# Patient Record
Sex: Male | Born: 1945 | Race: White | Hispanic: No | Smoking: Former smoker
Health system: Southern US, Community
[De-identification: ages and names within clinical notes are randomized; demographics above are authoritative.]

## PROBLEM LIST (undated history)

## (undated) DIAGNOSIS — I2510 Atherosclerotic heart disease of native coronary artery without angina pectoris: Secondary | ICD-10-CM

## (undated) DIAGNOSIS — I739 Peripheral vascular disease, unspecified: Secondary | ICD-10-CM

## (undated) DIAGNOSIS — I10 Essential (primary) hypertension: Secondary | ICD-10-CM

## (undated) DIAGNOSIS — I639 Cerebral infarction, unspecified: Secondary | ICD-10-CM

## (undated) DIAGNOSIS — J449 Chronic obstructive pulmonary disease, unspecified: Secondary | ICD-10-CM

## (undated) DIAGNOSIS — I340 Nonrheumatic mitral (valve) insufficiency: Secondary | ICD-10-CM

## (undated) DIAGNOSIS — E785 Hyperlipidemia, unspecified: Secondary | ICD-10-CM

## (undated) DIAGNOSIS — E119 Type 2 diabetes mellitus without complications: Secondary | ICD-10-CM

## (undated) DIAGNOSIS — I482 Chronic atrial fibrillation, unspecified: Secondary | ICD-10-CM

## (undated) HISTORY — PX: CHOLECYSTECTOMY: SHX55

## (undated) HISTORY — PX: OTHER SURGICAL HISTORY: SHX169

## (undated) HISTORY — PX: APPENDECTOMY: SHX54

## (undated) HISTORY — DX: Type 2 diabetes mellitus without complications: E11.9

## (undated) HISTORY — DX: Hyperlipidemia, unspecified: E78.5

## (undated) HISTORY — DX: Chronic obstructive pulmonary disease, unspecified: J44.9

## (undated) HISTORY — DX: Chronic atrial fibrillation, unspecified: I48.20

## (undated) HISTORY — DX: Nonrheumatic mitral (valve) insufficiency: I34.0

## (undated) HISTORY — DX: Essential (primary) hypertension: I10

## (undated) HISTORY — DX: Atherosclerotic heart disease of native coronary artery without angina pectoris: I25.10

## (undated) HISTORY — DX: Peripheral vascular disease, unspecified: I73.9

## (undated) HISTORY — DX: Cerebral infarction, unspecified: I63.9

---

## 1995-02-17 DIAGNOSIS — I639 Cerebral infarction, unspecified: Secondary | ICD-10-CM

## 1995-02-17 HISTORY — DX: Cerebral infarction, unspecified: I63.9

## 1997-07-11 ENCOUNTER — Inpatient Hospital Stay (HOSPITAL_COMMUNITY): Admission: AD | Admit: 1997-07-11 | Discharge: 1997-07-14 | Payer: Self-pay | Admitting: *Deleted

## 1997-11-30 ENCOUNTER — Inpatient Hospital Stay (HOSPITAL_COMMUNITY): Admission: EM | Admit: 1997-11-30 | Discharge: 1997-12-13 | Payer: Self-pay | Admitting: *Deleted

## 2007-04-08 ENCOUNTER — Encounter: Payer: Self-pay | Admitting: Cardiovascular Disease

## 2007-04-12 ENCOUNTER — Encounter: Payer: Self-pay | Admitting: Cardiovascular Disease

## 2007-05-22 ENCOUNTER — Encounter: Payer: Self-pay | Admitting: Cardiovascular Disease

## 2008-04-22 ENCOUNTER — Encounter: Payer: Self-pay | Admitting: Cardiovascular Disease

## 2008-04-22 ENCOUNTER — Ambulatory Visit: Payer: Self-pay | Admitting: Family Medicine

## 2008-04-22 DIAGNOSIS — I219 Acute myocardial infarction, unspecified: Secondary | ICD-10-CM | POA: Insufficient documentation

## 2008-04-23 ENCOUNTER — Encounter: Payer: Self-pay | Admitting: Family Medicine

## 2008-04-25 ENCOUNTER — Encounter: Payer: Self-pay | Admitting: Cardiovascular Disease

## 2008-04-26 ENCOUNTER — Encounter: Payer: Self-pay | Admitting: Cardiovascular Disease

## 2008-11-20 ENCOUNTER — Encounter: Payer: Self-pay | Admitting: Cardiovascular Disease

## 2009-01-23 ENCOUNTER — Encounter: Payer: Self-pay | Admitting: Cardiovascular Disease

## 2009-03-24 ENCOUNTER — Ambulatory Visit: Payer: Self-pay | Admitting: Cardiology

## 2009-03-24 ENCOUNTER — Inpatient Hospital Stay (HOSPITAL_COMMUNITY): Admission: EM | Admit: 2009-03-24 | Discharge: 2009-03-29 | Payer: Self-pay | Admitting: Emergency Medicine

## 2009-03-24 ENCOUNTER — Encounter: Payer: Self-pay | Admitting: Cardiovascular Disease

## 2009-03-25 ENCOUNTER — Ambulatory Visit: Payer: Self-pay | Admitting: Vascular Surgery

## 2009-03-25 ENCOUNTER — Encounter (INDEPENDENT_AMBULATORY_CARE_PROVIDER_SITE_OTHER): Payer: Self-pay | Admitting: Internal Medicine

## 2009-03-28 ENCOUNTER — Encounter: Payer: Self-pay | Admitting: Cardiology

## 2009-04-15 ENCOUNTER — Telehealth (INDEPENDENT_AMBULATORY_CARE_PROVIDER_SITE_OTHER): Payer: Self-pay | Admitting: *Deleted

## 2009-04-16 ENCOUNTER — Ambulatory Visit: Payer: Self-pay | Admitting: Cardiovascular Disease

## 2009-04-16 DIAGNOSIS — R0989 Other specified symptoms and signs involving the circulatory and respiratory systems: Secondary | ICD-10-CM | POA: Insufficient documentation

## 2009-04-16 DIAGNOSIS — I4891 Unspecified atrial fibrillation: Secondary | ICD-10-CM

## 2009-04-16 DIAGNOSIS — R0609 Other forms of dyspnea: Secondary | ICD-10-CM

## 2009-04-16 DIAGNOSIS — R079 Chest pain, unspecified: Secondary | ICD-10-CM

## 2009-04-16 DIAGNOSIS — E785 Hyperlipidemia, unspecified: Secondary | ICD-10-CM

## 2009-04-16 DIAGNOSIS — I2510 Atherosclerotic heart disease of native coronary artery without angina pectoris: Secondary | ICD-10-CM

## 2009-04-16 DIAGNOSIS — I10 Essential (primary) hypertension: Secondary | ICD-10-CM

## 2009-04-16 DIAGNOSIS — R0602 Shortness of breath: Secondary | ICD-10-CM | POA: Insufficient documentation

## 2009-04-16 LAB — CONVERTED CEMR LAB: POC INR: 1.6

## 2009-04-22 ENCOUNTER — Telehealth (INDEPENDENT_AMBULATORY_CARE_PROVIDER_SITE_OTHER): Payer: Self-pay | Admitting: Radiology

## 2009-04-23 ENCOUNTER — Ambulatory Visit: Payer: Self-pay | Admitting: Cardiovascular Disease

## 2009-04-23 ENCOUNTER — Encounter (HOSPITAL_COMMUNITY): Admission: RE | Admit: 2009-04-23 | Discharge: 2009-06-18 | Payer: Self-pay | Admitting: Cardiovascular Disease

## 2009-04-23 ENCOUNTER — Ambulatory Visit: Payer: Self-pay

## 2009-04-25 ENCOUNTER — Telehealth: Payer: Self-pay | Admitting: Cardiovascular Disease

## 2009-04-29 ENCOUNTER — Encounter: Payer: Self-pay | Admitting: Internal Medicine

## 2009-04-29 ENCOUNTER — Encounter (INDEPENDENT_AMBULATORY_CARE_PROVIDER_SITE_OTHER): Payer: Self-pay | Admitting: Cardiology

## 2009-04-29 ENCOUNTER — Ambulatory Visit: Payer: Self-pay | Admitting: Internal Medicine

## 2009-04-29 ENCOUNTER — Ambulatory Visit: Payer: Self-pay | Admitting: Cardiovascular Disease

## 2009-05-03 ENCOUNTER — Telehealth: Payer: Self-pay | Admitting: Cardiovascular Disease

## 2009-05-06 ENCOUNTER — Ambulatory Visit: Payer: Self-pay | Admitting: Internal Medicine

## 2009-05-13 ENCOUNTER — Ambulatory Visit: Payer: Self-pay | Admitting: Internal Medicine

## 2009-05-13 ENCOUNTER — Encounter: Payer: Self-pay | Admitting: Cardiovascular Disease

## 2009-05-20 ENCOUNTER — Ambulatory Visit: Payer: Self-pay | Admitting: Internal Medicine

## 2009-05-20 LAB — CONVERTED CEMR LAB: POC INR: 3

## 2009-05-30 ENCOUNTER — Ambulatory Visit: Payer: Self-pay | Admitting: Cardiovascular Disease

## 2009-05-30 ENCOUNTER — Ambulatory Visit: Payer: Self-pay | Admitting: Internal Medicine

## 2009-05-30 LAB — CONVERTED CEMR LAB: POC INR: 2.9

## 2009-06-06 ENCOUNTER — Ambulatory Visit: Payer: Self-pay | Admitting: Cardiovascular Disease

## 2009-06-06 ENCOUNTER — Ambulatory Visit: Payer: Self-pay | Admitting: Cardiology

## 2009-06-06 ENCOUNTER — Ambulatory Visit (HOSPITAL_COMMUNITY): Admission: RE | Admit: 2009-06-06 | Discharge: 2009-06-06 | Payer: Self-pay | Admitting: Cardiovascular Disease

## 2009-06-11 ENCOUNTER — Ambulatory Visit: Payer: Self-pay | Admitting: Cardiovascular Disease

## 2009-06-11 LAB — CONVERTED CEMR LAB: POC INR: 3.2

## 2009-07-08 ENCOUNTER — Ambulatory Visit: Payer: Self-pay | Admitting: Internal Medicine

## 2009-07-17 ENCOUNTER — Ambulatory Visit: Payer: Self-pay | Admitting: Cardiology

## 2009-07-17 HISTORY — PX: OTHER SURGICAL HISTORY: SHX169

## 2009-07-17 LAB — CONVERTED CEMR LAB: POC INR: 2.7

## 2009-07-24 ENCOUNTER — Ambulatory Visit: Payer: Self-pay | Admitting: Cardiovascular Disease

## 2009-07-24 LAB — CONVERTED CEMR LAB: POC INR: 3.3

## 2009-07-29 ENCOUNTER — Ambulatory Visit: Payer: Self-pay | Admitting: Cardiovascular Disease

## 2009-08-06 ENCOUNTER — Ambulatory Visit: Payer: Self-pay | Admitting: Internal Medicine

## 2009-08-06 LAB — CONVERTED CEMR LAB
BUN: 13 mg/dL (ref 6–23)
Calcium: 9.4 mg/dL (ref 8.4–10.5)
Chloride: 107 meq/L (ref 96–112)
Creatinine, Ser: 0.9 mg/dL (ref 0.4–1.5)
Eosinophils Absolute: 0.1 10*3/uL (ref 0.0–0.7)
Eosinophils Relative: 3 % (ref 0.0–5.0)
Hemoglobin: 14.5 g/dL (ref 13.0–17.0)
INR: 1.8 — ABNORMAL HIGH (ref 0.8–1.0)
Lymphocytes Relative: 19.4 % (ref 12.0–46.0)
MCV: 88.8 fL (ref 78.0–100.0)
Neutro Abs: 3.4 10*3/uL (ref 1.4–7.7)
POC INR: 1.9
Platelets: 207 10*3/uL (ref 150.0–400.0)
Potassium: 4.2 meq/L (ref 3.5–5.1)
aPTT: 34.3 s — ABNORMAL HIGH (ref 21.7–28.8)

## 2009-08-08 ENCOUNTER — Encounter: Payer: Self-pay | Admitting: Internal Medicine

## 2009-08-08 ENCOUNTER — Ambulatory Visit (HOSPITAL_BASED_OUTPATIENT_CLINIC_OR_DEPARTMENT_OTHER)
Admission: RE | Admit: 2009-08-08 | Discharge: 2009-08-08 | Payer: Self-pay | Source: Home / Self Care | Admitting: Internal Medicine

## 2009-08-12 ENCOUNTER — Ambulatory Visit: Payer: Self-pay | Admitting: Cardiology

## 2009-08-12 ENCOUNTER — Ambulatory Visit (HOSPITAL_COMMUNITY)
Admission: RE | Admit: 2009-08-12 | Discharge: 2009-08-12 | Payer: Self-pay | Source: Home / Self Care | Admitting: Cardiology

## 2009-08-12 LAB — CONVERTED CEMR LAB: POC INR: 2.8

## 2009-08-13 ENCOUNTER — Ambulatory Visit (HOSPITAL_COMMUNITY): Admission: RE | Admit: 2009-08-13 | Discharge: 2009-08-14 | Payer: Self-pay | Admitting: Internal Medicine

## 2009-08-13 ENCOUNTER — Ambulatory Visit: Payer: Self-pay | Admitting: Internal Medicine

## 2009-08-14 ENCOUNTER — Ambulatory Visit (HOSPITAL_COMMUNITY): Admission: RE | Admit: 2009-08-14 | Discharge: 2009-08-14 | Payer: Self-pay | Admitting: Internal Medicine

## 2009-08-21 ENCOUNTER — Ambulatory Visit: Payer: Self-pay | Admitting: Cardiology

## 2009-08-21 ENCOUNTER — Ambulatory Visit: Payer: Self-pay | Admitting: Internal Medicine

## 2009-08-21 LAB — CONVERTED CEMR LAB: POC INR: 1.9

## 2009-08-24 ENCOUNTER — Ambulatory Visit: Payer: Self-pay | Admitting: Pulmonary Disease

## 2009-08-26 ENCOUNTER — Ambulatory Visit: Payer: Self-pay | Admitting: Internal Medicine

## 2009-09-06 ENCOUNTER — Ambulatory Visit: Payer: Self-pay | Admitting: Internal Medicine

## 2009-09-06 ENCOUNTER — Ambulatory Visit: Payer: Self-pay | Admitting: Cardiology

## 2009-09-06 LAB — CONVERTED CEMR LAB: POC INR: 1.8

## 2009-09-09 ENCOUNTER — Encounter: Payer: Self-pay | Admitting: Internal Medicine

## 2009-09-13 ENCOUNTER — Encounter: Payer: Self-pay | Admitting: Internal Medicine

## 2009-09-17 ENCOUNTER — Encounter: Payer: Self-pay | Admitting: Internal Medicine

## 2009-10-01 ENCOUNTER — Ambulatory Visit: Payer: Self-pay | Admitting: Internal Medicine

## 2009-10-01 ENCOUNTER — Ambulatory Visit (HOSPITAL_COMMUNITY): Admission: RE | Admit: 2009-10-01 | Discharge: 2009-10-01 | Payer: Self-pay | Admitting: Internal Medicine

## 2009-10-07 ENCOUNTER — Telehealth: Payer: Self-pay | Admitting: Internal Medicine

## 2009-10-28 ENCOUNTER — Telehealth: Payer: Self-pay | Admitting: Internal Medicine

## 2009-10-31 ENCOUNTER — Telehealth: Payer: Self-pay | Admitting: Internal Medicine

## 2009-10-31 ENCOUNTER — Encounter: Payer: Self-pay | Admitting: Internal Medicine

## 2009-11-08 ENCOUNTER — Encounter: Payer: Self-pay | Admitting: Internal Medicine

## 2009-11-11 ENCOUNTER — Ambulatory Visit: Payer: Self-pay | Admitting: Internal Medicine

## 2009-11-21 ENCOUNTER — Telehealth (INDEPENDENT_AMBULATORY_CARE_PROVIDER_SITE_OTHER): Payer: Self-pay | Admitting: *Deleted

## 2009-12-03 ENCOUNTER — Encounter: Payer: Self-pay | Admitting: Internal Medicine

## 2010-01-31 ENCOUNTER — Telehealth: Payer: Self-pay | Admitting: Internal Medicine

## 2010-02-24 ENCOUNTER — Ambulatory Visit
Admission: RE | Admit: 2010-02-24 | Discharge: 2010-02-24 | Payer: Self-pay | Source: Home / Self Care | Attending: Internal Medicine | Admitting: Internal Medicine

## 2010-02-24 ENCOUNTER — Encounter: Payer: Self-pay | Admitting: Internal Medicine

## 2010-02-24 LAB — CONVERTED CEMR LAB
Cholesterol, target level: 200 mg/dL
HDL goal, serum: 40 mg/dL
LDL Goal: 100 mg/dL

## 2010-03-16 LAB — CONVERTED CEMR LAB
AST: 34 units/L (ref 0–37)
Albumin: 4.5 g/dL (ref 3.5–5.2)
BUN: 11 mg/dL (ref 6–23)
CO2: 27 meq/L (ref 19–32)
Chloride: 102 meq/L (ref 96–112)
Creatinine, Ser: 1 mg/dL (ref 0.4–1.5)
GFR calc non Af Amer: 80.06 mL/min (ref >60)
POC INR: 2.2
TSH: 1.21 microintl units/mL (ref 0.35–5.50)
Total Bilirubin: 2.8 mg/dL — ABNORMAL HIGH (ref 0.3–1.2)
Total Protein: 7.1 g/dL (ref 6.0–8.3)

## 2010-03-20 NOTE — Medication Information (Signed)
Summary: rov/ewj  Anticoagulant Therapy  Managed by: Cloyde Reams, RN, BSN Referring MD: Clifton James PCP: Dr. Tonie Griffith Supervising MD: Tenny Craw MD, Gunnar Fusi Indication 1: Atrial Fibrillation Lab Used: [facility] [place] Site: [address] INR POC 3.8 INR RANGE 2.0-3.0  Dietary changes: yes       Details: Incr ETOH yesterday watching ball game and race.    Health status changes: no    Bleeding/hemorrhagic complications: no    Recent/future hospitalizations: no    Any changes in medication regimen? no    Recent/future dental: no  Any missed doses?: no       Is patient compliant with meds? yes       Allergies (verified): 1)  ! Simvastatin (Simvastatin)  Anticoagulation Management History:      The patient is taking warfarin and comes in today for a routine follow up visit.  Negative risk factors for bleeding include an age less than 74 years old.  The bleeding index is 'low risk'.  Positive CHADS2 values include History of HTN.  Negative CHADS2 values include Age > 82 years old.  Anticoagulation responsible provider: Tenny Craw MD, Gunnar Fusi.  INR POC: 3.8.  Cuvette Lot#: [id number].  Exp: 06/2010.    Anticoagulation Management Assessment/Plan:      The patient's current anticoagulation dose is Coumadin 5 mg tabs: [facility] [place].  The target INR is 2.0-3.0.  The next INR is due 05/20/2009.  Anticoagulation instructions were given to patient.  Results were reviewed/authorized by Cloyde Reams, RN, BSN.  He was notified by Cloyde Reams RN.         Prior Anticoagulation Instructions: INR 2.6  Continue on same dosage 1/2 tablet daily except 1 tablet on Mondays and Fridays.  Recheck in 1 week.    Current Anticoagulation Instructions: INR 3.8  Take 1/2 tablet today then resume same dosage 1/2 tablet daily except 1 tablet on Mondays and Fridays.  Recheck in 1 week.

## 2010-03-20 NOTE — Letter (Signed)
Summary: [facility] Cardiology Cardiac Evaluation  [facility] Cardiology Cardiac Evaluation   Imported By: Roderic Ovens 04/30/2009 15:13:43  _____________________________________________________________________  External Attachment:    Type:   Image     Comment:   External Document

## 2010-03-20 NOTE — Cardiovascular Report (Signed)
Summary: [facility]  [facility]   Imported By: Roderic Ovens 07/24/2009 15:41:31  _____________________________________________________________________  External Attachment:    Type:   Image     Comment:   External Document

## 2010-03-20 NOTE — Medication Information (Signed)
Summary: coumadin f/u sl  Anticoagulant Therapy  Managed by: Cloyde Reams, RN, BSN Referring MD: Clifton James PCP: Dr. Tonie Griffith Supervising MD: Tenny Craw MD, Gunnar Fusi Indication 1: Atrial Fibrillation Lab Used: [facility] [place] Site: [address] INR POC 2.6 INR RANGE 2.0-3.0  Dietary changes: no    Health status changes: no    Bleeding/hemorrhagic complications: no    Recent/future hospitalizations: no    Any changes in medication regimen? yes       Details: Off 325 ASA and Metoprolol.  Recent/future dental: no  Any missed doses?: no       Is patient compliant with meds? yes       Allergies (verified): 1)  ! Simvastatin (Simvastatin)  Anticoagulation Management History:      The patient is taking warfarin and comes in today for a routine follow up visit.  Negative risk factors for bleeding include an age less than 44 years old.  The bleeding index is 'low risk'.  Positive CHADS2 values include History of HTN.  Negative CHADS2 values include Age > 68 years old.  Anticoagulation responsible provider: Tenny Craw MD, Gunnar Fusi.  INR POC: 2.6.  Cuvette Lot#: [id number].  Exp: 06/2010.    Anticoagulation Management Assessment/Plan:      The patient's current anticoagulation dose is Coumadin 5 mg tabs: [facility] [place].  The target INR is 2.0-3.0.  The next INR is due 05/13/2009.  Anticoagulation instructions were given to patient.  Results were reviewed/authorized by Cloyde Reams, RN, BSN.  He was notified by Cloyde Reams RN.         Prior Anticoagulation Instructions: INR 2.7  Continue 1 tab each Monday and Friday and 0.5 tab other days.    Current Anticoagulation Instructions: INR 2.6  Continue on same dosage 1/2 tablet daily except 1 tablet on Mondays and Fridays.  Recheck in 1 week.

## 2010-03-20 NOTE — Assessment & Plan Note (Signed)
Summary: appt @ 10:30/eph   Visit Type:  Follow-up Referring Provider:  Dr Clifton Lynnsie Linders Primary Provider:  Dr. Tonie Griffith   History of Present Illness: The patient presents today for electrophysiology followup. He remains in afib post ablation. I tried to cardiovert him 8/11 post ablation and was unsuccessful.  He reports ongoing dypsnea and fatigue.  He had nausea with multaq but notes that this is "better than the side effects of amiodarone".  He is presently not on an antiarrhythmic drug.  The patient denies symptoms of palpitations, chest pain, orthopnea, PND, lower extremity edema, dizziness, presyncope, syncope, or neurologic sequela. The patient is tolerating medications without difficulties and is otherwise without complaint today.   Current Medications (verified): 1)  Aspir-Low 81 Mg Tbec (Aspirin) .... Once Daily 2)  Crestor 20 Mg Tabs (Rosuvastatin Calcium) .... Take One Tab By Mouth Once Daily 3)  Lisinopril 10 Mg Tabs (Lisinopril) .... Once Daily 4)  Niaspan 500 Mg Cr-Tabs (Niacin (Antihyperlipidemic)) .... Once Daily 5)  Pradaxa 150 Mg Caps (Dabigatran Etexilate Mesylate) .... One By Mouth Bid 6)  Levitra 20 Mg Tabs (Vardenafil Hcl) .... Prn 7)  Omeprazole 40 Mg Cpdr (Omeprazole) .... Prn 8)  Avodart 0.5 Mg Caps (Dutasteride) .... Take One Capsule By Mouth At Bedtime  Allergies: 1)  ! Simvastatin (Simvastatin)  Past History:  Past Medical History: Reviewed history from 08/26/2009 and no changes required. CAD s/p stents 1999, 2001-FFR of LAD 0.85 in March 2010, stress normal 3/11.  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 Cholecystectomy Appendectomy Traumatic thumb injury s/p repair Bilateral knee arthroscopic procedures s/p afib ablation  6/11  Social History: Reviewed history from 07/08/2009 and no changes required. Pt lives in [place], with a friend. Former tobacco abuse, quit 1999 Alcohol-12 pack per week No illicit drugs Disability Divorced 3 children  Review of Systems       All systems are reviewed and negative except as listed in the HPI.   Vital Signs:  Patient profile:   65 year old male Height:      73 inches Weight:      216 pounds BMI:     28.60 Pulse rate:   70 / minute Pulse rhythm:   irregularly irregular BP sitting:   110 / 80  (right arm)  Vitals Entered By: Laurance Flatten CMA (November 11, 2009 10:25 AM)  Physical Exam  General:  Well developed, well nourished, in no acute distress. Head:  normocephalic and atraumatic Eyes:  PERRLA/EOM intact; conjunctiva and lids normal. Mouth:  Teeth, gums and palate normal. Oral mucosa normal. Neck:  Neck supple, no JVD. No masses, thyromegaly or abnormal cervical nodes. Lungs:  Clear bilaterally to auscultation and percussion. Heart:  iRRR, no m/r/g Abdomen:  Bowel sounds positive; abdomen soft and non-tender without masses, organomegaly, or hernias noted. No hepatosplenomegaly. Msk:  Back normal, normal gait. Muscle strength and tone normal. Pulses:  pulses normal in all 4 extremities Extremities:  No clubbing or cyanosis. Neurologic:  Alert and oriented x 3. Skin:  Intact without lesions or rashes. Cervical Nodes:  no significant adenopathy Psych:  Normal affect.   EKG  Procedure date:  11/11/2009  Findings:      afib,  V rate 70 bpm, LAD, incomplete RBBB  Impression & Recommendations:  Problem # 1:  ATRIAL FIBRILLATION (ICD-427.31)  Refractory afib.  He has failed medical therapy with multaq and amiodarone.  He has had recurrent afib s/p ablation. Therapeutic strategies for afib including medicine and ablation were discussed in detail with the patient today. Risk, benefits, and alternatives to repeat EP study and radiofrequency  ablation for afib were also discussed in detail today. These risks include but are not limited to stroke, bleeding, vascular damage, tamponade, perforation, damage to the esophagus, lungs, and other structures, pulmonary vein stenosis, worsening renal function, and death. The patient understands these risk and wishes to proceed.  We will continue pradaxa 150mg  two times a day. He will proceed with cardiac CT prior to ablation to rule out pulmonary vein stenosis and evaluate the pulmonary anatomy prior to repeat ablation.  We will also plan TEE prior to ablation.  The following medications were removed from the medication list:    Multaq 400 Mg Tabs (Dronedarone hcl) .Marland Kitchen... Take one tab by mouth two times a day His updated medication list for this problem includes:    Aspir-low 81 Mg Tbec (Aspirin) ..... Once daily  Problem # 2:  HYPERTENSION, BENIGN (ICD-401.1)  stable His updated medication list for this problem includes:    Aspir-low 81 Mg Tbec (Aspirin) ..... Once daily    Lisinopril 10 Mg Tabs (Lisinopril) ..... Once daily  His updated medication list for this problem includes:    Aspir-low 81 Mg Tbec (Aspirin) ..... Once daily    Lisinopril 10 Mg Tabs (Lisinopril) ..... Once daily  Problem # 3:  CAD, NATIVE VESSEL (ICD-414.01)  no symptoms of ischemia continue medical therapy  His updated medication list for this problem includes:    Aspir-low 81 Mg Tbec (Aspirin) ..... Once daily    Lisinopril 10 Mg Tabs (Lisinopril) ..... Once daily  Problem # 4:  HYPERLIPIDEMIA-MIXED (ICD-272.4)  stable  His updated medication list for this problem includes:    Crestor 20 Mg Tabs (Rosuvastatin calcium) .Marland Kitchen... Take one tab by mouth once daily    Niaspan 500 Mg Cr-tabs (Niacin (antihyperlipidemic)) ..... Once daily  Other Orders: EKG w/ Interpretation (93000)  Patient Instructions: 1)  Your physician recommends that you schedule a follow-up appointment in: 4 weeks with Dr Johney Frame 2)  Will  schedule a repeat afib ablation for 12/24/09 at 5:30am  TEE on 12/23/09 and CT the week prior  will call to set all of these appointments up Prescriptions: PRADAXA 150 MG CAPS (DABIGATRAN ETEXILATE MESYLATE) one by mouth two times a day  #60 x 11   Entered by:   Laurance Flatten CMA   Authorized by:   Hillis Range, MD   Signed by:   Laurance Flatten CMA on 11/11/2009   Method used:   Print then Give to Patient   RxID:   [id number]

## 2010-03-20 NOTE — Letter (Signed)
Summary: [facility] Cardiology Office Note  [facility] Cardiology Office Note   Imported By: Roderic Ovens 04/30/2009 14:45:22  _____________________________________________________________________  External Attachment:    Type:   Image     Comment:   External Document

## 2010-03-20 NOTE — Medication Information (Signed)
Summary: rov/sp  Anticoagulant Therapy  Managed by: Elaina Pattee, PharmD Referring MD: Clifton James PCP: Dr. Tonie Griffith Supervising MD: Eden Emms MD, Theron Arista Indication 1: Atrial Fibrillation Lab Used: [facility] [place] Site: [address] INR POC 3.2 INR RANGE 2.0-3.0  Dietary changes: yes       Details: Drank more EtOH last night.  Health status changes: yes       Details: DCCV was unsuccessful.  Ablation in near future is possible.  Will continue to see him weekly so he will be ready for procedure and due to stopping amiodarone.  Bleeding/hemorrhagic complications: no    Recent/future hospitalizations: no    Any changes in medication regimen? yes       Details: DC amiodarone.  Will monitor more frequently for several weeks.  Recent/future dental: no  Any missed doses?: no       Is patient compliant with meds? yes       Allergies: 1)  ! Simvastatin (Simvastatin)  Anticoagulation Management History:      The patient is taking warfarin and comes in today for a routine follow up visit.  Negative risk factors for bleeding include an age less than 41 years old.  The bleeding index is 'low risk'.  Positive CHADS2 values include History of HTN.  Negative CHADS2 values include Age > 42 years old.  Anticoagulation responsible provider: Eden Emms MD, Theron Arista.  INR POC: 3.2.  Cuvette Lot#: [id number].  Exp: 07/2010.    Anticoagulation Management Assessment/Plan:      The patient's current anticoagulation dose is Coumadin 5 mg tabs: [facility] [place].  The target INR is 2.0-3.0.  The next INR is due 06/18/2009.  Anticoagulation instructions were given to patient.  Results were reviewed/authorized by Elaina Pattee, PharmD.  He was notified by Elaina Pattee, PharmD.         Prior Anticoagulation Instructions: INR 2.6  Continue same dose of 1/2 tablet every day except 1 tablet on Monday and Friday   Current Anticoagulation Instructions: INR 3.2.  Take 1.5 mg today,  then take 0.5 tablet daily except 1 tablet on Mondays and Fridays.

## 2010-03-20 NOTE — Assessment & Plan Note (Signed)
Summary: eph. appt is 1:45 per mat pa/ gd   Visit Type:  Follow-up Referring Provider:  Dr Clifton Zeplin [place] Primary Provider:  Dr. Tonie Griffith   History of Present Illness: The patient presents today for electrophysiology followup. He has had EFAF post ablation.  He reports dypsnea and fatigue.  The patient denies symptoms of palpitations, chest pain, orthopnea, PND, lower extremity edema, dizziness, presyncope, syncope, or neurologic sequela. The patient is tolerating medications without difficulties and is otherwise without complaint today.   Current Medications (verified): 1)  Aspir-Low 81 Mg Tbec (Aspirin) .... Once Daily 2)  Crestor 20 Mg Tabs (Rosuvastatin Calcium) .... Take One Tab By Mouth Once Daily 3)  Lisinopril 10 Mg Tabs (Lisinopril) .... Once Daily 4)  Niaspan 500 Mg Cr-Tabs (Niacin (Antihyperlipidemic)) .... Once Daily 5)  Coumadin 5 Mg Tabs (Warfarin Sodium) .... Once Daily [facility] [place] 6)  Levitra 20 Mg Tabs (Vardenafil Hcl) .... Prn 7)  Omeprazole 40 Mg Cpdr (Omeprazole) .... Prn 8)  Multaq 400 Mg Tabs (Dronedarone Hcl) .... Take One Tab By Mouth Two Times A Day 9)  Avodart 0.5 Mg Caps (Dutasteride) .... Take One Capsule By Mouth At Bedtime  Allergies (verified): 1)  ! Simvastatin (Simvastatin)  Past History:  Past Medical History: CAD s/p stents 1999, 2001-FFR of LAD 0.85 in March 2010, stress normal 3/11.  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 Head:  normocephalic and atraumatic Eyes:  PERRLA/EOM intact; conjunctiva and lids normal. Mouth:  Teeth, gums and palate normal. Oral mucosa normal. Neck:  JVP 10cm Lungs:  few bibasilar rales, nl WOB Heart:  iRRR, no m/r/g Abdomen:  Bowel sounds positive; abdomen soft and non-tender without masses, organomegaly, or hernias noted. No hepatosplenomegaly. Msk:  Back normal, normal gait. Muscle strength and tone normal. Pulses:  pulses normal in all 4 extremities Extremities:  No clubbing or cyanosis. Neurologic:  Alert and oriented x 3. Skin:  Intact without lesions or rashes. Cervical Nodes:  no significant adenopathy Psych:  Normal affect.   EKG  Procedure date:  08/26/2009  Findings:      afib,  V rate 70 bpm, rSr',  otherwise normal ekg  Impression & Recommendations:  Problem # 1:  ATRIAL FIBRILLATION (ICD-427.31) The patient has had early recurrence of afib after ablation (ERAF).  We will continue multaq and coumadin and plan cardioversion if he remains in afib.  He has new dyspnea.  We will therefore add lasix due to mild volume overload today. He will return in 1 week.  Patient Instructions: 1)  Your physician recommends that you schedule a follow-up appointment in: one week with Dr Johney Frame 2)  Your physician has recommended you make the following change in your medication: start Furosemide 40mg  daily Prescriptions: FUROSEMIDE 40 MG TABS (FUROSEMIDE) one by mouth daily  #30 x 6  Entered by:   Braydyn Bast, RN, BSN   Authorized by:   Hillis Range, MD   Signed by:   Darey Bast, RN, BSN on 08/26/2009   Method used:   Electronically to        [facility] [address] (retail)       [address]       [place], [place]  [address]       Ph: [phone]       Fax: [phone]   RxID:   [id number]

## 2010-03-20 NOTE — Medication Information (Signed)
Summary: rov/sp  Anticoagulant Therapy  Managed by: Elaina Pattee, PharmD Referring MD: Clifton James PCP: Dr. Tonie Griffith Supervising MD: Shirlee Latch MD, [place] Indication 1: Atrial Fibrillation Lab Used: [facility] [place] Site: [address] INR POC 2.8 INR RANGE 2.0-3.0  Dietary changes: no    Health status changes: no    Bleeding/hemorrhagic complications: no    Recent/future hospitalizations: yes       Details: Ablation with TEE scheduled for 08/13/09.  Any changes in medication regimen? no    Recent/future dental: no  Any missed doses?: no       Is patient compliant with meds? yes       Allergies: 1)  ! Simvastatin (Simvastatin)  Anticoagulation Management History:      The patient is taking warfarin and comes in today for a routine follow up visit.  Negative risk factors for bleeding include an age less than 72 years old.  The bleeding index is 'low risk'.  Positive CHADS2 values include History of HTN.  Negative CHADS2 values include Age > 40 years old.  His last INR was 1.8 ratio.  Anticoagulation responsible provider: Shirlee Latch MD, [place].  INR POC: 2.8.  Cuvette Lot#: [id number].  Exp: 10/2010.    Anticoagulation Management Assessment/Plan:      The patient's current anticoagulation dose is Coumadin 5 mg tabs: [facility] [place].  The target INR is 2.0-3.0.  The next INR is due 08/20/2009.  Anticoagulation instructions were given to patient.  Results were reviewed/authorized by Elaina Pattee, PharmD.  He was notified by Elaina Pattee, PharmD.         Prior Anticoagulation Instructions: INR 1.9  Take 1 tablet today then increase dose to 1/2 tablet every day except 1 tablet on Monday, Wednesday and Friday.   Current Anticoagulation Instructions: INR 2.8. Take 0.5 tablet daily except 1 tablet on Mon, Wed, Fri. Recheck in 1 week after ablation.

## 2010-03-20 NOTE — Assessment & Plan Note (Signed)
Summary: nurse visit  Nurse Visit   Vital Signs:  Patient profile:   65 year old male Height:      73 inches Pulse rate:   84 / minute Pulse rhythm:   irregular BP sitting:   126 / 80  (left arm) Cuff size:   large  Visit Type:  nurse visit Referring Provider:  Dr Clifton Tawni Melkonian Primary Provider:  Dr. Tonie Griffith   History of Present Illness: The pt was here today for a coumadin visit. He was added on for a nurse visit stating he has not felt well since his a-fib ablation on 6/28. He is SOB and has no energy. His EKG today shows a-fib. He states he is sleeping about 20 hours a day over the past 4 days. He is not able to eat, but is drinking. I reviewed this with Dr. Johney Frame. He briefly assessed the patient and the decision was made to d/c amiodarone, d/c cyclobenzeprine, and start multaq two times a day. He will keep his f/u appt. on 7/11 with Dr. Johney Frame. I have reviewed the patient's med changes with CVRR. Per Orma Flaming D, she will keep his coumadin the same for now and his followup will remain the same.   Other Orders: EKG w/ Interpretation (93000)   Current Medications (verified): 1)  Aspir-Low 81 Mg Tbec (Aspirin) .... Once Daily 2)  Crestor 20 Mg Tabs (Rosuvastatin Calcium) .... Take One Tab By Mouth Once Daily 3)  Lisinopril 10 Mg Tabs (Lisinopril) .... Once Daily 4)  Niaspan 500 Mg Cr-Tabs (Niacin (Antihyperlipidemic)) .... Once Daily 5)  Coumadin 5 Mg Tabs (Warfarin Sodium) .... Once Daily [facility] [place] 6)  Levitra 20 Mg Tabs (Vardenafil Hcl) .... Prn 7)  Omeprazole 40 Mg Cpdr (Omeprazole) .... Take One Tab By Mouth Once Daily 8)  Amiodarone Hcl 200 Mg Tabs (Amiodarone Hcl) .... Take One Tablet By Mouth Daily 9)  Avodart 0.5 Mg Caps (Dutasteride) .... Take One Capsule By Mouth At Bedtime 10)  Cyclobenzaprine Hcl 10 Mg Tabs (Cyclobenzaprine Hcl) .... Take One Tab By Mouth At Bedtime  Allergies (verified): 1)  ! Simvastatin (Simvastatin)  Past History:  Past  Medical History: Last updated: 07/08/2009 CAD s/p stents 1999, 2001-FFR of LAD 0.85 in March 2010, stress normal 3/11.  HTN Hyperlipidemia Chronic atrial fibrillation on coumadin therapy-failed cardioversion 06/06/09. 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 Johney Frame. 2)  Your physician has recommended you make the following change in your medication: 1) STOP amiodarone, 2) STOP cyclobenzaprine, 3) START Multaq two times a day   Appended Document: nurse visit Pt without significant volume overload on exam.  He reports similar symptoms of fatigue and dypsnea with amiodarone in the past.  We will stop amiodarone and start multaq today.  I will see him back next week for further assessment.

## 2010-03-20 NOTE — Progress Notes (Signed)
  Recieved Records from [facility] Cardiology, forwarded to [facility] for Dr.McAlhany NP appt 04/16/09. Mike Campos  April 15, 2009 2:43 PM

## 2010-03-20 NOTE — Medication Information (Signed)
Summary: [employer]  [employer]   Imported By: Earl Many 09/18/2009 13:22:32  _____________________________________________________________________  External Attachment:    Type:   Image     Comment:   External Document

## 2010-03-20 NOTE — Assessment & Plan Note (Signed)
Summary: eph/ appt is 1:30/gd   Visit Type:  Follow-up Primary Provider:  Dr. Tonie Griffith  CC:  Fatigue.  History of Present Illness: 65 yo WM with history of CAD with MI 1999 with several stents placed at that time and subsequent stent placement 2001 at the [facility] in [place], COPD, DM, hyperlipidemia, CVA, PVD and chronic atrial fibrillation on coumadin therapy  here today for folllow up. I saw him as a new pt on 04/16/09. He has a complex medical history. He has been treated for 10 years at the [facility] for COPD. It sounds like he was admitted to [facility] with chest pain and CHF in March 2010. He had a left heart cath which showed moderate disease in the LAD with FFR of 0.85.  05/14/08.  He has been in atrial fibrillation for many years and has been on coumadin.  He had a cardioversion many years ago by Dr. Corinda Gubler and reports that he felt much better in sinus rhythm.  He has been  mostly limited by fatigue and SOB. He can only walk a short distance before becoming profoundly dyspneic.  When I initially saw him, I arranged for a nuclear stress test and had him wear a 21 day event monitor. On 04/25/09, he had a 3 second pause. His metoprolol was held at that time. The following day, he had a run of atrial fib with RVR, heart rates in the 150s-160s and was asymptomatic during both the pause and the rapid rate. He is never aware of irregularities of his heart rhythm. Stress test showed no ischemia. At the last visit, his INR was demonstarted to be therapeutic for five weeks so elective cardioversion was arranged. The patient was started on amiodarone pre-cardioversion. Unfortunately, he was cardioverted three times but could not maintain sinus rhythm. He is here for follow up. He has been feeling well but still wtih fatigue and some dyspnea.   Of note, TEE 04/25/08 at [facility] with normal LV size and function, EF 65%.  Mild to moderate MR. Mildly enlarged LA.     Current  Medications (verified): 1)  Aspir-Low 81 Mg Tbec (Aspirin) .... Once Daily 2)  Crestor 20 Mg Tabs (Rosuvastatin Calcium) .... Once Daily 3)  Lisinopril 10 Mg Tabs (Lisinopril) .... Once Daily 4)  Niaspan 500 Mg Cr-Tabs (Niacin (Antihyperlipidemic)) .... Once Daily 5)  Coumadin 5 Mg Tabs (Warfarin Sodium) .... Once Daily Va in [place] 6)  Levitra 20 Mg Tabs (Vardenafil Hcl) .... Prn 7)  Omeprazole 20 Mg Cpdr (Omeprazole) .... Prn  Allergies (verified): 1)  ! Simvastatin (Simvastatin)  Past History:  Past Medical History: CAD s/p stents 1999, 2001-FFR of LAD 0.85 in March 2010, stress normal 3/11.  HTN Hyperlipidemia Chronic atrial fibrillation on coumadin therapy-failed cardioversion 06/06/09. COPD-? severity CVA-1997 DM PVD Mitral insufficiency Former tobacco abuse  Social History: Reviewed history from 04/16/2009 and no changes required. Former tobacco abuse, quit 1999 Alcohol-12 pack per week No illicit drugs Disability Divorced 3 children  Review of Systems       The patient complains of fatigue and shortness of breath.  The patient denies malaise, fever, weight gain/loss, vision loss, decreased hearing, hoarseness, chest pain, palpitations, prolonged cough, wheezing, sleep apnea, coughing up blood, abdominal pain, blood in stool, nausea, vomiting, diarrhea, heartburn, incontinence, blood in urine, muscle weakness, joint pain, leg swelling, rash, skin lesions, headache, fainting, dizziness, depression, anxiety, enlarged lymph nodes, easy bruising or bleeding, and environmental allergies.  Vital Signs:  Patient profile:   65 year old male Height:      73 inches Weight:      213 pounds BMI:     28.20 Pulse rate:   66 / minute Resp:     18 per minute BP sitting:   98 / 74  (left arm)  Vitals Entered By: Marrion Coy, CNA (June 11, 2009 1:43 PM)  Physical Exam  General:  General: Well developed, well nourished, NAD HEENT: OP clear, mucus membranes  moist Musculoskeletal: Muscle strength 5/5 all ext Psychiatric: Mood and affect normal Neck: No JVD, no carotid bruits, no thyromegaly, no lymphadenopathy. Lungs:Clear bilaterally, no wheezes, rhonci, crackles CV: Irregular. No murmurs, gallops rubs Abdomen: soft, NT, ND, BS present Extremities: No edema, pulses 2+.    EKG  Procedure date:  06/11/2009  Findings:      Atrial fibrillation, rate 66 bpm. RV conduction delay.   Impression & Recommendations:  Problem # 1:  ATRIAL FIBRILLATION (ICD-427.31) Mr. Bentson has failed cardioversion while  on amiodarone therapy. It is difficult to know if his fatigue and dyspnea is related to the atrial fibrillation. He clearly felt better several years ago when he was in sinus rhythm. I think it is worthwhile to explore other treatment options. I will refer him to see Dr. Johney Frame and discuss anti-arrythmics vs ablation. He may be  a good candidate for Tikosyn but I would like for Dr. Johney Frame to give [place] his opinion on this. His amiodarone was held after the attempted cardioversion because of bradycardia. For now, continue coumadin. He has exhibited no rapid rates over the last week off of amiodarone.   The following medications were removed from the medication list:    Amiodarone Hcl 200 Mg Tabs (Amiodarone hcl) .Marland Kitchen... Take one tablet by mouth twice a day His updated medication list for this problem includes:    Aspir-low 81 Mg Tbec (Aspirin) ..... Once daily    Coumadin 5 Mg Tabs (Warfarin sodium) ..... Once daily va in [place]  Orders: EKG w/ Interpretation (93000) EP Referral (Cardiology EP Ref )  Problem # 2:  CAD, NATIVE VESSEL (ICD-414.01) Stable.   His updated medication list for this problem includes:    Aspir-low 81 Mg Tbec (Aspirin) ..... Once daily    Lisinopril 10 Mg Tabs (Lisinopril) ..... Once daily    Coumadin 5 Mg Tabs (Warfarin sodium) ..... Once daily va in [place]  Patient Instructions: 1)  Your physician  recommends that you schedule a follow-up appointment in: 6 months 2)  Your physician recommends that you continue on your current medications as directed. Please refer to the Current Medication list given to you today. 3)  You have been referred to Dr. Johney Frame

## 2010-03-20 NOTE — Medication Information (Signed)
Summary: ROV  Anticoagulant Therapy  Managed by: Cloyde Reams, RN, BSN Referring MD: Juliette Alcide MD: Clifton James MD, Cristal Deer Indication 1: Atrial Fibrillation Lab Used: [facility] [place] Site: [address] INR POC 1.6 INR RANGE 2.0-3.0  Dietary changes: no    Health status changes: no    Bleeding/hemorrhagic complications: no    Recent/future hospitalizations: yes       Details: Pt was in hospital approx 2 weeks ago with a bleed in his head, per pt report.  Any changes in medication regimen? no    Recent/future dental: no  Any missed doses?: yes     Details: Off coumadin in hospital, resumed on 04/12/09 at 5mg  daily.   Comments: Pt's previous dosage has been 2.5mg  once daily/5mg  MF.  Pt has been previously followed at [place].  Allergies: 1)  ! Simvastatin (Simvastatin)  Anticoagulation Management History:      The patient comes in today for his initial visit for anticoagulation therapy.  Negative risk factors for bleeding include an age less than 65 years old.  The bleeding index is 'low risk'.  Positive CHADS2 values include History of HTN.  Negative CHADS2 values include Age > 65 years old.  Anticoagulation responsible provider: Clifton James MD, Cristal Deer.  INR POC: 1.6.  Cuvette Lot#: [id number].  Exp: 06/2010.    Anticoagulation Management Assessment/Plan:      The patient's current anticoagulation dose is Coumadin 5 mg tabs: once daily [facility] in [place].  The target INR is 2.0-3.0.  The next INR is due 04/23/2009.  Results were reviewed/authorized by Cloyde Reams, RN, BSN.  He was notified by Cloyde Reams RN.        Coagulation management information includes: Pt pending DCCV.  Cloyde Reams RN  April 16, 2009 2:51 PM .  Current Anticoagulation Instructions: INR 1.6  Advised pt to resume previous dosage schedule 2.5mg  daily except 5mg  on MF.  Recheck in 1 week.  Pending DCCV.

## 2010-03-20 NOTE — Assessment & Plan Note (Signed)
Summary: Mike Campos/ Subdural hematoma felt to be secondary to fall from #3 s...   Visit Type:  Follow-up Primary Provider:  Dr. Tonie Griffith  CC:  shortness of breath and chest feel like weight on it/swelling in lt arm and painful.  History of Present Illness: 65 yo WM with history of CAD with MI 1999 with several stents placed at that time and subsequent stent placement 2001 at the [facility] in [place], COPD, DM, hyperlipidemia, CVA, PVD and chronic atrial fibrillation on coumadin therapy  here today for initial evaluation. He has a complex medical history. He has been treated for 10 years at the [facility] for COPD. It sounds like he was admitted to [facility] with chest pain and CHF in March 2010. He had a left heart cath which is unavailable at this time. I do see a report of an FFR of the LAD which was 0.85 on 05/14/08. He was told my a pulmonologist at that time that his lungs were ok. He has been in atrial fibrillation for many years and has his coumadin followed at the [facility], last check 1/11. He had a cardioversion many years ago by Dr. Corinda Gubler. About three weeks ago, he was having a beer with his friend and passed out. He sustained a subdural hematoma as a result of the fall. He was admitted to [facility] and was seen by Dr. Myrtis Ser. His cardiac enzymes were negative. He had some wide complex tachycardia per consult note.   He tells me today that he has been having some heaviness in his chest. This pressure is there most of the time. He has very rare pains. This does affect his breathing. He has not noticed his heart racing or skipping beats.  He is mostly limited by fatigue and SOB. He can only walk a short distance before becoming profoundly dyspneic.  He has been followed in the [facility] for his cardiac issues. He has also been seen by Dr. Kassie Mends in [facility], most recently 4/09. As above, cath 3/10 at [facility] with stable disease.   Problems  Prior to Update: 1)  Hyperlipidemia-mixed  (ICD-272.4) 2)  Atrial Fibrillation  (ICD-427.31) 3)  Atrial Fibrillation  (ICD-427.31) 4)  Chest Pain  (ICD-786.50) 5)  Other Dyspnea and Respiratory Abnormalities  (ICD-786.09) 6)  Hyperlipidemia-mixed  (ICD-272.4) 7)  Hypertension, Benign  (ICD-401.1) 8)  Cad, Native Vessel  (ICD-414.01) 9)  Coronary Atherosclerosis Native Coronary Artery  (ICD-414.01) 10)  Dyspnea  (ICD-786.05) 11)  AMI  (ICD-410.90)  Current Medications (verified): 1)  Aspir-Low 81 Mg Tbec (Aspirin) .... Once Daily 2)  Aspirin Ec 325 Mg Tbec (Aspirin) .... Take One Tablet By Mouth Daily 3)  Crestor 20 Mg Tabs (Rosuvastatin Calcium) .... Once Daily 4)  Lisinopril 10 Mg Tabs (Lisinopril) .... Once Daily 5)  Niaspan 500 Mg Cr-Tabs (Niacin (Antihyperlipidemic)) .... Once Daily 6)  Coumadin 5 Mg Tabs (Warfarin Sodium) .... Once Daily Va in [place] 7)  Levitra 20 Mg Tabs (Vardenafil Hcl) .... Prn 8)  Omeprazole 20 Mg Cpdr (Omeprazole) .... Prn 9)  Metoprolol Tartrate 50 Mg Tabs (Metoprolol Tartrate) .... 1/2 Tab Bid  Allergies (verified): 1)  ! Simvastatin (Simvastatin)  Past History:  Past Medical History: CAD s/p stents 1999, 2001 HTN Hyperlipidemia Chronic atrial fibrillation on coumadin therapy COPD CVA-1997 DM PVD Mitral insufficiency Former tobacco abuse  Past Surgical History: Cholecystectomy Appendectomy Traumatic thumb injury s/p repair Bilateral knee arthroscopic procedures  Family History: Mother-deceased, Alzheimers  Father-deceased, CAD/Pacemaker Sister alive  Social History: Former tobacco abuse, quit 1999 Alcohol-12 pack per week No illicit drugs Disability Divorced 3 children  Review of Systems       The patient complains of chest pain, fatigue, shortness of breath, fainting, and dizziness.  The patient denies malaise, fever, weight gain/loss, vision loss, decreased hearing, hoarseness, palpitations, prolonged cough,  wheezing, sleep apnea, coughing up blood, abdominal pain, blood in stool, nausea, vomiting, diarrhea, heartburn, incontinence, blood in urine, muscle weakness, joint pain, leg swelling, rash, skin lesions, headache, depression, anxiety, enlarged lymph nodes, easy bruising or bleeding, and environmental allergies.    Vital Signs:  Patient profile:   65 year old male Height:      73 inches Weight:      213 pounds BMI:     28.20 Pulse rate:   58 / minute BP sitting:   126 / 80  (right arm) Cuff size:   regular  Vitals Entered By: Oswald Hillock (April 16, 2009 11:04 AM)  Physical Exam  General:  General: Well developed, well nourished, NAD HEENT: OP clear, mucus membranes moist SKIN: warm, dry Neuro: No focal deficits Musculoskeletal: Muscle strength 5/5 all ext Psychiatric: Mood and affect normal Neck: No JVD, no carotid bruits, no thyromegaly, no lymphadenopathy. Lungs:Clear bilaterally, no wheezes, rhonci, crackles CV: RRR no murmurs, gallops rubs Abdomen: soft, NT, ND, BS present Extremities: No edema, pulses 2+.    EKG  Procedure date:  04/16/2009  Findings:      Atrial fibrillation, rate 66 bpm. RV conduction delay.   Echocardiogram  Procedure date:  03/25/2009  Findings:      - Left ventricle: Systolic function was normal. The estimated     ejection fraction was in the range of 55% to 60%. The study is not     technically sufficient to allow evaluation of LV diastolic     function.   - Mitral valve: Mild to moderate regurgitation.   - Left atrium: The atrium was mildly dilated.   - Right ventricle: Systolic pressure was increased.   - Pulmonary arteries: Systolic pressure was mildly increased. PA     peak pressure: 37mm Hg (S).  Impression & Recommendations:  Problem # 1:  ATRIAL FIBRILLATION (ICD-427.31) Rate controlled. Chronic. There have been no attempts to restore sinus rhythm over the last few years. He has been on coumadin long term but this was  recently held after he fell and sustained a subdural hematoma 03/24/09. Coumadin was restarted last week. He has his INR followed in the [place] in [place]. Will continue current therapy. Will check INR today. I will have him wear a 21 day monitor. If he remains in atrial fibrillation at time of follow up in one month and INR is therapeutic, will consider DCCV.   The following medications were removed from the medication list:    Aspirin Ec 325 Mg Tbec (Aspirin) .Marland Kitchen... Take one tablet by mouth daily His updated medication list for this problem includes:    Aspir-low 81 Mg Tbec (Aspirin) ..... Once daily    Coumadin 5 Mg Tabs (Warfarin sodium) ..... Once daily va in [place]    Metoprolol Tartrate 50 Mg Tabs (Metoprolol tartrate) .Marland Kitchen... 1/2 tab bid  Problem # 2:  CAD, NATIVE VESSEL (ICD-414.01) Recurrent chest pain with typical and atypical features. Cath 1 year  ago with stable disease. Will plan stress myoview. He is on good medical therapy.   The following medications were removed from the medication list:  Aspirin Ec 325 Mg Tbec (Aspirin) .Marland Kitchen... Take one tablet by mouth daily His updated medication list for this problem includes:    Aspir-low 81 Mg Tbec (Aspirin) ..... Once daily    Lisinopril 10 Mg Tabs (Lisinopril) ..... Once daily    Coumadin 5 Mg Tabs (Warfarin sodium) ..... Once daily va in [place]    Metoprolol Tartrate 50 Mg Tabs (Metoprolol tartrate) .Marland Kitchen... 1/2 tab bid  Problem # 3:  DYSPNEA (ICD-786.05) This may be related to CAD, atrial fibrillation or underlying lung disease. It is not clear if he actually has COPD. Recent CXR without infiltrate. Echo with suggestion of mild pulmonary HTN. (Right heart cath 3/10 with PA 32/14). Will order PFTs to evaluate.   Problem # 4:  HYPERLIPIDEMIA-MIXED (ICD-272.4) Continue statin.   His updated medication list for this problem includes:    Crestor 20 Mg Tabs (Rosuvastatin calcium) ..... Once daily    Niaspan 500 Mg  Cr-tabs (Niacin (antihyperlipidemic)) ..... Once daily  Other Orders: EKG w/ Interpretation (93000) Event (Event) Pulmonary Function Test (PFT) Nuclear Stress Test (Nuc Stress Test) [address] Coumadin Clinic Referral (Coumadin clinic)  Patient Instructions: 1)  Your physician recommends that you schedule a follow-up appointment in: 3-4 weeks 2)  You have been referred to coumadin clinic. First appointment next Tuesday. 3)  Your physician has recommended that you wear an event monitor.  Event monitors are medical devices that record the heart's electrical activity. Doctors most often use these monitors to diagnose arrhythmias. Arrhythmias are problems with the speed or rhythm of the heartbeat. The monitor is a small, portable device. You can wear one while you do your normal daily activities. This is usually used to diagnose what is causing palpitations/syncope (passing out). 4)  Your physician has requested that you have an exercise stress myoview.  For further information please visit [email or web address]  Please follow instruction sheet, as given. 5)  Your physician has recommended that you have a pulmonary function test.  Pulmonary Function Tests are a group of tests that measure how well air moves in and out of your lungs.

## 2010-03-20 NOTE — Procedures (Signed)
Summary: Event  Event   Imported By: Marylou Mccoy 06/28/2009 09:39:03  _____________________________________________________________________  External Attachment:    Type:   Image     Comment:   External Document

## 2010-03-20 NOTE — Progress Notes (Signed)
Summary: Med List  Med List   Imported By: Roderic Ovens 04/18/2009 14:19:00  _____________________________________________________________________  External Attachment:    Type:   Image     Comment:   External Document

## 2010-03-20 NOTE — Medication Information (Signed)
Summary: rov/sp  Anticoagulant Therapy  Managed by: Elaina Pattee, PharmD Referring MD: Clifton James PCP: Dr. Tonie Griffith Supervising MD: Myrtis Ser MD, Tinnie Gens Indication 1: Atrial Fibrillation Lab Used: [facility] [place] Site: [address] INR POC 2.7 INR RANGE 2.0-3.0  Dietary changes: no    Health status changes: yes       Details: Persistent AF, now having ablation.  Bleeding/hemorrhagic complications: no    Recent/future hospitalizations: yes       Details: Scheduled for ablation 6/28.  Needs weekly INRs until then per Dr. Jenel Lucks note.  Any changes in medication regimen? no    Recent/future dental: no  Any missed doses?: no       Is patient compliant with meds? yes      Comments: EtOH use is stable.  Drinks 4-5 beers several times per week.  Allergies: 1)  ! Simvastatin (Simvastatin)  Anticoagulation Management History:      The patient is taking warfarin and comes in today for a routine follow up visit.  Negative risk factors for bleeding include an age less than 80 years old.  The bleeding index is 'low risk'.  Positive CHADS2 values include History of HTN.  Negative CHADS2 values include Age > 65 years old.  Anticoagulation responsible provider: Myrtis Ser MD, Tinnie Gens.  INR POC: 2.7.  Cuvette Lot#: [id number].  Exp: 09/2010.    Anticoagulation Management Assessment/Plan:      The patient's current anticoagulation dose is Coumadin 5 mg tabs: once daily [facility] in [place].  The target INR is 2.0-3.0.  The next INR is due 07/24/2009.  Anticoagulation instructions were given to patient.  Results were reviewed/authorized by Elaina Pattee, PharmD.  He was notified by Elaina Pattee, PharmD.         Prior Anticoagulation Instructions: INR 3.2.  Take 1.5 mg today, then take 0.5 tablet daily except 1 tablet on Mondays and Fridays.  Current Anticoagulation Instructions: INR 2.7. Take 0.5 tablet daily except 1 tablet on Mon and Fri.

## 2010-03-20 NOTE — Letter (Signed)
Summary: ELectrophysiology/Ablation Procedure Instructions  [facility], Main Office  [address]   [place], [place] [address]   Phone: [phone]  Fax: [phone]     Electrophysiology/Ablation Procedure Instructions    You are scheduled for a(n) a-fib ablation on 08/13/09 at 7:30am with Dr. Johney Frame.  1.  Please come to the Short Stay Center at [facility] at 5:30am on the day of your procedure.  2.  Come prepared to stay overnight.   Please bring your insurance cards and a list of your medications.  3.  Come to the [place] office on 08/06/09 for lab work.   You do not have to be fasting.  Will also need to have weekly protimes starting next week in the Coumadin clinic  4.  Do not have anything to eat or drink after midnight the night before your procedure.  5.    All of your  medications may be taken with a small amount of water.  6.  Educational material received:    _____ Ablation   * Occasionally, EP studies and ablations can become lengthy.  Please make your family aware of this before your procedure starts.  Average time ranges from 2-8 hours for EP studies/ablations.  Your physician will locate your family after the procedure with the results.  * If you have any questions after you get home, please call the office at [phone]. Anselm Pancoast  TEE--08/12/09 with Dr Shirlee Latch Check in at Short Stay at [facility]  Nothing to eat or drink after [place] the night before your procedure,  Be at the hospital at 9:30am

## 2010-03-20 NOTE — Miscellaneous (Signed)
Summary: pt instructions  Clinical Lists Changes  Problems: Added new problem of SNORING (ICD-786.09) Orders: Added new Referral order of Sleep Disorder Referral (Sleep Disorder) - Signed Observations: Added new observation of PI CARDIO: Your physician has recommended that you have an ablation.  Catheter ablation is a medical procedure used to treat some cardiac arrhythmias (irregular heartbeats). During catheter ablation, a long, thin, flexible tube is put into a blood vessel in your groin (upper thigh), or neck. This tube is called an ablation catheter. It is then guided to your heart through the blood vessel. Radiofrequency waves destroy small areas of heart tissue where abnormal heartbeats may cause an arrhythmia to start.  Please see the instruction sheet given to you today. Your physician has recommended that you have a sleep study.  This test records several body functions during sleep, including:  brain activity, eye movement, oxygen and carbon dioxide blood levels, heart rate and rhythm, breathing rate and rhythm, the flow of air through your mouth and nose, snoring, body muscle movements, and chest and belly movement. (07/10/2009 15:32)      Patient Instructions: 1)  Your physician has recommended that you have an ablation.  Catheter ablation is a medical procedure used to treat some cardiac arrhythmias (irregular heartbeats). During catheter ablation, a long, thin, flexible tube is put into a blood vessel in your groin (upper thigh), or neck. This tube is called an ablation catheter. It is then guided to your heart through the blood vessel. Radiofrequency waves destroy small areas of heart tissue where abnormal heartbeats may cause an arrhythmia to start.  Please see the instruction sheet given to you today. 2)  Your physician has recommended that you have a sleep study.  This test records several body functions during sleep, including:  brain activity, eye movement, oxygen and carbon  dioxide blood levels, heart rate and rhythm, breathing rate and rhythm, the flow of air through your mouth and nose, snoring, body muscle movements, and chest and belly movement.

## 2010-03-20 NOTE — Assessment & Plan Note (Signed)
Summary: Cardiology Nuclear Study  Nuclear Med Background Indications for Stress Test: Evaluation for Ischemia, Stent Patency, PTCA Patency  Indications Comments: 03/24/09 [facility] Syncope after beer>fall> subdural hematoma, WCT,(-) enzymes  History: Cardioversion, COPD, Echo, Myocardial Infarction, Myocardial Perfusion Study, Stents  History Comments: 1999- MI treated with mult. stents and stents also in 2001(No records). 3 yrs. ago MPS. 3/10- Cath- Stable disease.2/11- Nl EF by Echo. HX.Chronic A.Fib, CHF.  Symptoms: Chest Pressure, Chest Pressure with Exertion, Chest Tightness, Chest Tightness with Exertion, DOE, Fatigue, Fatigue with Exertion, Light-Headedness, Palpitations, Rapid HR, SOB, Syncope  Symptoms Comments: 03/24/09- Syncopal episode in setting of ETOH-Fall and subsequent subdural hematoma   Nuclear Pre-Procedure Cardiac Risk Factors: CVA, Family History - CAD, History of Smoking, Hypertension, Lipids, NIDDM, PVD Caffeine/Decaff Intake: None NPO After: 7:00 PM Lungs: Clear IV 0.9% NS with Angio Cath: 22g     IV Site: (R) AC IV Started by: Irean Hong RN Chest Size (in) 42     Height (in): 73 Weight (lb): 213 BMI: 28.20 Tech Comments: Attempted treadmill x2 in past, and had to change to medication per patient.  Nuclear Med Study 1 or 2 day study:  1 day     Stress Test Type:  Lexiscan Reading MD:  Charlton Haws, MD     Referring MD:  Clifton James Resting Radionuclide:  Technetium 69m Tetrofosmin     Resting Radionuclide Dose:  11.0 mCi  Stress Radionuclide:  Technetium 27m Tetrofosmin     Stress Radionuclide Dose:  32.0 mCi   Stress Protocol      Max HR:  96 bpm Max Systolic BP: 130 mm HgRate Pressure Product:  [address]  Lexiscan: 0.4 mg   Stress Test Technologist:  Irean Hong RN     Nuclear Technologist:  Burna Mortimer Deal RT-N  Rest Procedure  Myocardial perfusion imaging was performed at rest 45 minutes following the intravenous administration of Myoview Technetium 45m  Tetrofosmin.  Stress Procedure  The patient received IV Lexiscan 0.4 mg over 15-seconds.  Myoview injected at 30-seconds.  There were no significant changes with infusion.  Quantitative spect images were obtained after a 45 minute delay.  QPS Raw Data Images:  Normal; no motion artifact; normal heart/lung ratio. Stress Images:  NI: Uniform and normal uptake of tracer in all myocardial segments. Rest Images:  Normal homogeneous uptake in all areas of the myocardium. Subtraction (SDS):  Normal Transient Ischemic Dilatation:  1.08  (Normal <1.22)  Lung/Heart Ratio:  .31  (Normal <0.45)  Quantitative Gated Spect Images QGS EDV:  83 ml QGS ESV:  34 ml QGS EF:  59 % QGS cine images:  normal  Findings Normal nuclear study      Overall Impression  Exercise Capacity: Lexiscan BP Response: Normal blood pressure response. Clinical Symptoms: Dyspnea ECG Impression: No significant ST segment change suggestive of ischemia. Overall Impression: Normal stress nuclear study.  Appended Document: Cardiology Nuclear Study Normal study. cdm  Appended Document: Cardiology Nuclear Study Pt. notified

## 2010-03-20 NOTE — Medication Information (Signed)
Summary: rov/cb  Anticoagulant Therapy  Managed by: Weston Brass, PharmD Referring MD: Clifton James PCP: Dr. Tonie Griffith Supervising MD: Shirlee Latch MD, Freida Busman Indication 1: Atrial Fibrillation Lab Used: [facility] [place] Site: [address] INR POC 1.9 INR RANGE 2.0-3.0  Dietary changes: no    Health status changes: yes       Details: sleeping  ~20 hours per day since ablation and not feeling well.  Will fit in to see cardiologist today.   Bleeding/hemorrhagic complications: no    Recent/future hospitalizations: no    Any changes in medication regimen? yes       Details: started amiodarone after ablation   Recent/future dental: no  Any missed doses?: no       Is patient compliant with meds? yes       Allergies: 1)  ! Simvastatin (Simvastatin)  Anticoagulation Management History:      The patient is taking warfarin and comes in today for a routine follow up visit.  Negative risk factors for bleeding include an age less than 35 years old.  The bleeding index is 'low risk'.  Positive CHADS2 values include History of HTN.  Negative CHADS2 values include Age > 68 years old.  His last INR was 1.8 ratio.  Anticoagulation responsible provider: Shirlee Latch MD, Bobie Caris.  INR POC: 1.9.  Cuvette Lot#: [id number].  Exp: 10/2010.    Anticoagulation Management Assessment/Plan:      The patient's current anticoagulation dose is Coumadin 5 mg tabs: once daily [facility] [place].  The target INR is 2.0-3.0.  The next INR is due 09/04/2009.  Anticoagulation instructions were given to patient.  Results were reviewed/authorized by Weston Brass, PharmD.  He was notified by Weston Brass PharmD.         Prior Anticoagulation Instructions: INR 2.8. Take 0.5 tablet daily except 1 tablet on Mon, Wed, Fri. Recheck in 1 week after ablation.  Current Anticoagulation Instructions: INR 1.9  Take 1 tablet today then resume same dose of 1/2 tablet every day except 1 tablet on Monday and Friday.

## 2010-03-20 NOTE — Medication Information (Signed)
Summary: rov/sp  Anticoagulant Therapy  Managed by: Weston Brass, PharmD Referring MD: Clifton James PCP: Dr. Tonie Griffith Supervising MD: Jens Som MD, Arlys John Indication 1: Atrial Fibrillation Lab Used: [facility] [place] Site: [address] INR POC 1.8 INR RANGE 2.0-3.0  Dietary changes: no    Health status changes: no    Bleeding/hemorrhagic complications: no    Recent/future hospitalizations: no    Any changes in medication regimen? yes       Details: seeing Dr. Johney Frame today, may change his Multaq to another med  Recent/future dental: no  Any missed doses?: no       Is patient compliant with meds? yes      Comments: pending cardioversion  Allergies: 1)  ! Simvastatin (Simvastatin)  Anticoagulation Management History:      The patient is taking warfarin and comes in today for a routine follow up visit.  Negative risk factors for bleeding include an age less than 5 years old.  The bleeding index is 'low risk'.  Positive CHADS2 values include History of HTN.  Negative CHADS2 values include Age > 67 years old.  His last INR was 1.8 ratio.  Anticoagulation responsible provider: Jens Som MD, Arlys John.  INR POC: 1.8.  Cuvette Lot#: [id number].  Exp: 11/2010.    Anticoagulation Management Assessment/Plan:      The patient's current anticoagulation dose is Coumadin 5 mg tabs: once daily [facility] [place].  The target INR is 2.0-3.0.  The next INR is due 09/23/2009.  Anticoagulation instructions were given to patient.  Results were reviewed/authorized by Weston Brass, PharmD.  He was notified by Dillard Cannon.         Prior Anticoagulation Instructions: INR 1.9  Take 1 tablet today then resume same dose of 1/2 tablet every day except 1 tablet on Monday and Friday.    Current Anticoagulation Instructions: INR 1.8  Take 1.5 tabs today and then change to 1 tab on Monday, Wednesday, and Friday and 1/2 tab all other days.  Re-check in 2 weeks.

## 2010-03-20 NOTE — Letter (Signed)
Summary: Cardioversion/TEE Instructions  Architectural technologist, Main Office  [address]   [place], [place] [address]   Phone: [phone]  Fax: [phone]    Cardioversion   You are scheduled for a Cardioversion on _____April 21, 2011______________________ with Dr. ______________Nishan___________________________.   Please arrive at the [facility] at _noon____________ . on the day of your procedure.  1)   DIET:  A)   Nothing to eat or drink after midnight except your medications with a sip of water.    2)   Come to the [place] office on __April 21 for coumadin appt.__________________ for lab work. The lab at [facility] is open from 8:30 a.m. to 1:30 p.m. and 2:30 p.m. to 5:00 p.m. The lab at [facility] is open from 7:30 a.m. to 5:30 p.m. You do not have to be fasting.  3)   MAKE SURE YOU TAKE YOUR COUMADIN.  4)   A)   DO NOT TAKE these medications before your procedure:      You may take all your medications with a small sip of water ___________________________________________________________________     ___________________________________________________________________     ___________________________________________________________________  B)   YOU MAY TAKE ALL of your remaining medications with a small amount of water.    C)   START NEW medications:       Amiodarone 200 mg by mouth twice a day ___________________________________________________________________     ___________________________________________________________________  5)  Must have a responsible person to drive you home.  6)   Bring a current list of your medications and current insurance cards.   * Special Note:  Every effort is made to have your procedure done on time. Occasionally there are emergencies that present themselves at the hospital that may cause delays. Please be patient if a delay does occur.  * If you have any questions  after you get home, please call the office at [phone].

## 2010-03-20 NOTE — Letter (Signed)
Summary: [facility] Medical Office Note  [facility] Medical Office Note   Imported By: Roderic Ovens 04/30/2009 14:47:23  _____________________________________________________________________  External Attachment:    Type:   Image     Comment:   External Document

## 2010-03-20 NOTE — Medication Information (Signed)
Summary: rov/ewj  Anticoagulant Therapy  Managed by: Eda Keys, PharmD Referring MD: Sanjuana Kava PCP: Dr. Tonie Griffith Supervising MD: Eden Emms MD, Theron Arista Indication 1: Atrial Fibrillation Lab Used: [facility] [place] Site: [address] INR POC 2.2 INR RANGE 2.0-3.0  Dietary changes: no    Health status changes: no    Bleeding/hemorrhagic complications: no    Recent/future hospitalizations: no    Any changes in medication regimen? no    Recent/future dental: no  Any missed doses?: no       Is patient compliant with meds? yes       Allergies: 1)  ! Simvastatin (Simvastatin)  Anticoagulation Management History:      The patient is taking warfarin and comes in today for a routine follow up visit.  Negative risk factors for bleeding include an age less than 3 years old.  The bleeding index is 'low risk'.  Positive CHADS2 values include History of HTN.  Negative CHADS2 values include Age > 62 years old.  Anticoagulation responsible provider: Eden Emms MD, Theron Arista.  INR POC: 2.2.  Cuvette Lot#: [id number].  Exp: 06/2010.    Anticoagulation Management Assessment/Plan:      The patient's current anticoagulation dose is Coumadin 5 mg tabs: once daily [facility] [place].  The target INR is 2.0-3.0.  The next INR is due 04/30/2009.  Results were reviewed/authorized by Eda Keys, PharmD.  He was notified by Eda Keys.         Prior Anticoagulation Instructions: INR 1.6  Advised pt to resume previous dosage schedule 2.5mg  daily except 5mg  on MF.  Recheck in 1 week.  Pending DCCV.  Current Anticoagulation Instructions: INR 2.2  Continue current dosing schedule of 1 tablet on Monday and Friday and 1/2 tablet all other days.  Return to clinic in 1 week.  Patient pending cardioversion.

## 2010-03-20 NOTE — Progress Notes (Signed)
Summary: Monitor  Phone Note Other Incoming   Caller: [place] Summary of Call: Called reporting at [phone] CST the pt had a 3 sec pause that was Asymptomatic, underlying rythm of AF 50-100bpm. I will pass this on to Windell Moulding who will receive the report.  Initial call taken by: Duncan Dull, RN, BSN,  April 25, 2009 8:53 AM  Follow-up for Phone Call        I received the monitor results and s/w Dr. Johney Frame who ask that the pt HOLD his Metoprolol and f/u with Dr. Clifton James if he is asymptomatic as the report states. I called the pt and he is not having any symptoms other than the SOB that was happening prior to his OV and he says it is getting worse. I will forward this to Dennie Bible so that they can get the pt scheduled. Follow-up by: Duncan Dull, RN, BSN,  April 25, 2009 4:37 PM  Additional Follow-up for Phone Call Additional follow up Details #1::        Spoke with pt. He will come in to see Dr. Clifton James on April 29, 2009 at 12:30 Additional Follow-up by: Dossie Arbour, RN, BSN,  April 26, 2009 8:48 AM     Appended Document: Monitor Received call from [facility] that pt had heart rate in 160's- Afib-. Lasted for less than 5 minutes and then back to rate around 100. Called pt and his only complaint is continued SOB but states it is no worse.  Strips received from [facility] and reviewed with Dr. Antoine Poche. (DOD)  No changes needed at this time but pt needs to keep appt with Dr. Clifton James for March 14.  Appended Document: Monitor Will see pt today. cdm

## 2010-03-20 NOTE — Assessment & Plan Note (Signed)
Summary: 427.31/pla   Visit Type:  rov Primary Provider:  Dr. Tonie Griffith  CC:  sob..denies any other complaints today.  History of Present Illness: 65 yo WM with history of CAD with MI 1999 with several stents placed at that time and subsequent stent placement 2001 at the [facility] in [place], COPD, DM, hyperlipidemia, CVA, PVD and chronic atrial fibrillation on coumadin therapy  here today for folllow up. I saw him as a new patient two weeks ago.   He has a complex medical history. He has been treated for 10 years at the [facility] for COPD. It sounds like he was admitted to [facility] with chest pain and CHF in March 2010. He had a left heart cath which is unavailable at this time. I do see a report of an FFR of the LAD which was 0.85 on 05/14/08. He was told by a pulmonologist at that time that his lungs were ok. He has been in atrial fibrillation for many years and has his coumadin followed at the [facility], last check 1/11. He had a cardioversion many years ago by Dr. Corinda Gubler. About one month ago, he was having a beer with his friend and passed out. He sustained a subdural hematoma as a result of the fall. He was admitted to [facility] and was seen by Dr. Myrtis Ser. His cardiac enzymes were negative. He had some wide complex tachycardia per consult note. He is mostly limited by fatigue and SOB. He can only walk a short distance before becoming profoundly dyspneic.  He has been followed in the [facility] for his cardiac issues. He has also been seen by Dr. Kassie Mends in [facility], most recently 4/09. As above, cath 3/10 at [facility] with stable disease.    When I saw him, I arranged for a nuclear stress test and had him wear a 21 day event monitor. On 04/25/09, he had a 3 second pause. His metoprolol was held at that time. The following day, he had a run of atrial fib with RVR, heart rates in the 150s-160s and was asymptomatic during both the pause and the rapid  rate. He is never aware of irregularities of his heart rhythm. He continues to feel fatigued and has baseline SOB. He states that he felt this way the last time he was in atrial fib many years ago. His first INR on 04/16/09 was 1.6. Last week was 2.2 and today is 2.7.   Current Medications (verified): 1)  Aspir-Low 81 Mg Tbec (Aspirin) .... Once Daily 2)  Crestor 20 Mg Tabs (Rosuvastatin Calcium) .... Once Daily 3)  Lisinopril 10 Mg Tabs (Lisinopril) .... Once Daily 4)  Niaspan 500 Mg Cr-Tabs (Niacin (Antihyperlipidemic)) .... Once Daily 5)  Coumadin 5 Mg Tabs (Warfarin Sodium) .... Once Daily [facility] in [place] 6)  Levitra 20 Mg Tabs (Vardenafil Hcl) .... Prn 7)  Omeprazole 20 Mg Cpdr (Omeprazole) .... Prn  Allergies: 1)  ! Simvastatin (Simvastatin)  Past History:  Past Medical History: Reviewed history from 04/16/2009 and no changes required. CAD s/p stents 1999, 2001 HTN Hyperlipidemia Chronic atrial fibrillation on coumadin therapy COPD CVA-1997 DM PVD Mitral insufficiency Former tobacco abuse  Social History: Reviewed history from 04/16/2009 and no changes required. Former tobacco abuse, quit 1999 Alcohol-12 pack per week No illicit drugs Disability Divorced 3 children  Review of Systems       The patient complains of fatigue and shortness of breath.  The patient denies malaise,  fever, weight gain/loss, vision loss, decreased hearing, hoarseness, chest pain, palpitations, prolonged cough, wheezing, sleep apnea, coughing up blood, abdominal pain, blood in stool, nausea, vomiting, diarrhea, heartburn, incontinence, blood in urine, muscle weakness, joint pain, leg swelling, rash, skin lesions, headache, fainting, dizziness, depression, anxiety, enlarged lymph nodes, easy bruising or bleeding, and environmental allergies.    Vital Signs:  Patient profile:   65 year old male Height:      73 inches Weight:      214 pounds BMI:     28.34 Pulse rate:   60 /  minute Pulse rhythm:   irregular BP sitting:   118 / 60  (left arm) Cuff size:   large  Vitals Entered By: Danielle Rankin, CMA (April 29, 2009 12:16 PM)  Physical Exam  General:  General: Well developed, well nourished, NAD Musculoskeletal: Muscle strength 5/5 all ext Psychiatric: Mood and affect normal Neck: No JVD, no carotid bruits, no thyromegaly, no lymphadenopathy. Lungs:Clear bilaterally, no wheezes, rhonci, crackles CV: RRR no murmurs, gallops rubs Abdomen: soft, NT, ND, BS present Extremities: No edema, pulses 2+.    EKG  Procedure date:  04/23/2009  Findings:      Stress Procedure   The patient received IV Lexiscan 0.4 mg over 15-seconds.  Myoview injected at 30-seconds.  There were no significant changes with infusion.  Quantitative spect images were obtained after a 45 minute delay.  QPS  Raw Data Images:  Normal; no motion artifact; normal heart/lung ratio. Stress Images:  NI: Uniform and normal uptake of tracer in all myocardial segments. Rest Images:  Normal homogeneous uptake in all areas of the myocardium. Subtraction (SDS):  Normal Transient Ischemic Dilatation:  1.08  (Normal <1.22)  Lung/Heart Ratio:  .31  (Normal <0.45)  Quantitative Gated Spect Images  QGS EDV:  83 ml QGS ESV:  34 ml QGS EF:  59 % QGS cine images:  normal  Findings  Normal nuclear study   Overall Impression   Exercise Capacity: Lexiscan BP Response: Normal blood pressure response. Clinical Symptoms: Dyspnea ECG Impression: No significant ST segment change suggestive of ischemia. Overall Impression: Normal stress nuclear study.   Impression & Recommendations:  Problem # 1:  CAD, NATIVE VESSEL (ICD-414.01) Stable. Stress test without ischemia.   The following medications were removed from the medication list:    Metoprolol Tartrate 50 Mg Tabs (Metoprolol tartrate) .Marland Kitchen... 1/2 tab bid His updated medication list for this problem includes:    Aspir-low 81 Mg Tbec (Aspirin)  ..... Once daily    Lisinopril 10 Mg Tabs (Lisinopril) ..... Once daily    Coumadin 5 Mg Tabs (Warfarin sodium) ..... Once daily [facility] in [place]  Problem # 2:  ATRIAL FIBRILLATION (ICD-427.31) He remains fatigued and dyspneic while in atrial fib. He has had no attempts at cardioversion in his cardiologists office in the last three years. I have confirmed that his INR has been therapeutic for the last week. We will continue coumadin and coumadin clinic follow up. Once INR therapeutic for one month, will plan DCCV. Unfortunately, he has had pauses on beta blocker therapy. 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 He may ultimately need a pacemaker down the road with a recent syncopal event, documented pauses and runs of atrial fib with RVR off of beta blocker therapy.   The following medications were removed from the medication list:    Metoprolol Tartrate 50 Mg Tabs (Metoprolol tartrate) .Marland Kitchen... 1/2 tab  bid His updated medication list for this problem includes:    Aspir-low 81 Mg Tbec (Aspirin) ..... Once daily    Coumadin 5 Mg Tabs (Warfarin sodium) ..... Once daily [facility] in [place]  Patient Instructions: 1)  Your physician recommends that you schedule a follow-up appointment in: 1 month 2)  Your physician recommends that you continue on your current medications as directed. Please refer to the Current Medication list given to you today.

## 2010-03-20 NOTE — Letter (Signed)
Summary: ELectrophysiology/Ablation Procedure Instructions  [employer], Main Office  [address]   [place], [place] [address]   Phone: [phone]  Fax: [phone]     Electrophysiology/Ablation Procedure Instructions    You are scheduled for a(n) a fib ablation  on 12/24/09 at 7:30am with Dr. Johney Frame.  1.  Please come to the Short Stay Center at [facility] at 5:30am on the day of your procedure.  2.  Come prepared to stay overnight.   Please bring your insurance cards and a list of your medications.  3.  Come to the [place] office on 12/17/09 at          for lab work.    You do not have to be fasting.  4.  Do not have anything to eat or drink after midnight the night before your procedure.  5.   All of your remaining medications may be taken with a small amount of water.  6.  Educational material received:  Ablation   * Occasionally, EP studies and ablations can become lengthy.  Please make your family aware of this before your procedure starts.  Average time ranges from 2-8 hours for EP studies/ablations.  Your physician will locate your family after the procedure with the results.  * If you have any questions after you get home, please call the office at [phone]. Anselm Pancoast  TEE--12/23/09  Go to Short Stay at [facility] at 9:00am  Nothing to eat or drink after midnight the night prior to your procedure.  TEE is scheduled at 10:00  CT--Sharonda from our office will call you to schedule this procedure

## 2010-03-20 NOTE — Miscellaneous (Signed)
Summary: Cardiac CT  Clinical Lists Changes  Orders: Added new Referral order of CT Scan  (CT Scan) - Signed

## 2010-03-20 NOTE — Progress Notes (Signed)
Summary: Nuc Pre-Procedure  Phone Note Outgoing Call Call back at Home Phone [phone]   Call placed by: Harlow Asa, CNMT Call placed to: Patient Reason for Call: Confirm/change Appt Summary of Call: Reviewed information on Myoview Information Sheet (see scanned document for further details).  Spoke with patient.     Nuclear Med Background Indications for Stress Test: Evaluation for Ischemia, Stent Patency   History: Cardioversion, COPD, Echo, Myocardial Infarction, Stents  History Comments: 1999- MI treated with mult. stents and stents also in 2001(No records). 3/10- Cath- Stable disease.2/11- Nl EF by Echo. Chronic A.Fib.  Symptoms: Chest Pressure, DOE, Fatigue with Exertion, SOB, Syncope  Symptoms Comments: 03/24/09- Syncopal episode in setting of ETOH-Fall and subsequent subdural hematoma   Nuclear Pre-Procedure Cardiac Risk Factors: CVA, Family History - CAD, History of Smoking, Hypertension, Lipids, PVD Height (in): 73  Nuclear Med Study Referring MD:  Sanjuana Kava

## 2010-03-20 NOTE — Medication Information (Signed)
Summary: rov/tm  Anticoagulant Therapy  Managed by: Weston Brass, PharmD Referring MD: Clifton James PCP: Dr. Tonie Griffith Supervising MD: Eden Emms MD, Theron Arista Indication 1: Atrial Fibrillation Lab Used: [facility] [place] Site: [address] INR POC 2.2 INR RANGE 2.0-3.0  Dietary changes: no    Health status changes: no    Bleeding/hemorrhagic complications: no    Recent/future hospitalizations: no    Any changes in medication regimen? yes       Details: will start Avodart soon  Recent/future dental: no  Any missed doses?: yes     Details: missed 1 dose on Friday  Is patient compliant with meds? yes       Allergies: 1)  ! Simvastatin (Simvastatin)  Anticoagulation Management History:      The patient is taking warfarin and comes in today for a routine follow up visit.  Negative risk factors for bleeding include an age less than 48 years old.  The bleeding index is 'low risk'.  Positive CHADS2 values include History of HTN.  Negative CHADS2 values include Age > 62 years old.  Anticoagulation responsible provider: Eden Emms MD, Theron Arista.  INR POC: 2.2.  Cuvette Lot#: [id number].  Exp: 09/2010.    Anticoagulation Management Assessment/Plan:      The patient's current anticoagulation dose is Coumadin 5 mg tabs: [facility] daily [facility] [place].  The target INR is 2.0-3.0.  The next INR is due 08/05/2009.  Anticoagulation instructions were given to patient.  Results were reviewed/authorized by Weston Brass, PharmD.  He was notified by Weston Brass PharmD.         Prior Anticoagulation Instructions: INR 3.3 Continue 2.5mg s daily except 5mg s on Mondays and Fridays. Recheck in one week.   Current Anticoagulation Instructions: INR 2.2  Take 1 1/2 tablets today then resume same dose of 1/2 tablet every day except 1 tablet on Monday and Friday.  Recheck in 1 week.

## 2010-03-20 NOTE — Letter (Signed)
Summary: Cardioversion/TEE Instructions  [facility], Main Office  [address]   [place], [place] [address]   Phone: [phone]  Fax: [phone]    Cardioversion  Instructions  09/17/2009 MRN: [id number]  Mike Campos [address] [place], [place]  [address]  Dear Mr. TSUTSUI, You are scheduled for a Cardioversion on 10/01/09 with Dr. Gala Romney.   Please arrive at the [facility] at 12:30p.m. on the day of your procedure.  1)   DIET:    B)   May have clear liquid breakfast, then nothing to eat or drink after 6:00 a.m. / p.m.      Clear liquids include:  water, broth, Sprite, Ginger Ale, black coffee, tea (no sugar),      cranberry / grape / apple juice, jello (not red), popsicle from clear juices (not red).   3)   MAKE SURE YOU TAKE YOUR PRADAXA   B)   YOU MAY TAKE ALL of your remaining medications with a small amount of water.  _  4)  Must have a responsible person to drive you home.  5)   Bring a current list of your medications and current insurance cards.   * Special Note:  Every effort is made to have your procedure done on time. Occasionally there are emergencies that present themselves at the hospital that may cause delays. Please be patient if a delay does occur.  * If you have any questions after you get home, please call the office at [phone].

## 2010-03-20 NOTE — Medication Information (Signed)
Summary: ROV COUMADIN  Anticoagulant Therapy  Managed by: Weston Brass, PharmD Referring MD: Clifton James PCP: Dr. Tonie Griffith Supervising MD: Myrtis Ser MD, Tinnie Gens Indication 1: Atrial Fibrillation Lab Used: [facility] [place] Site: [address] INR POC 2.6 INR RANGE 2.0-3.0  Dietary changes: no    Health status changes: no    Bleeding/hemorrhagic complications: no    Recent/future hospitalizations: yes       Details: having cardioversion this afternoon  Any changes in medication regimen? yes       Details: started amiodarone about 2 weeks ago; has not taken aspirin 81mg  in about a week- was out of it and forgot to get more  Recent/future dental: no  Any missed doses?: yes     Details: missed 1 dose last Friday   Is patient compliant with meds? yes       Allergies: 1)  ! Simvastatin (Simvastatin)  Anticoagulation Management History:      The patient is taking warfarin and comes in today for a routine follow up visit.  Negative risk factors for bleeding include an age less than 50 years old.  The bleeding index is 'low risk'.  Positive CHADS2 values include History of HTN.  Negative CHADS2 values include Age > 89 years old.  Anticoagulation responsible provider: Myrtis Ser MD, Tinnie Gens.  INR POC: 2.6.  Exp: 06/2010.    Anticoagulation Management Assessment/Plan:      The patient's current anticoagulation dose is Coumadin 5 mg tabs: once daily [facility] in [place].  The target INR is 2.0-3.0.  The next INR is due 06/13/2009.  Anticoagulation instructions were given to patient.  Results were reviewed/authorized by Weston Brass, PharmD.  He was notified by Weston Brass PharmD.         Prior Anticoagulation Instructions: INR 2.9  CONTINUE PRESENT DOSING SCHEDULE COUMADIN 1 TAB ON MONDAY,FRIDAY  1/2 TABLET ALL OTHER DAYS  Current Anticoagulation Instructions: INR 2.6  Continue same dose of 1/2 tablet every day except 1 tablet on Monday and Friday

## 2010-03-20 NOTE — Progress Notes (Signed)
Summary: refill request  Phone Note Refill Request Message from:  Patient on October 07, 2009 2:53 PM  Refills Requested: Medication #1:  PRADAXA 150 MG CAPS one by mouth bid [facility] [phone] fax #   Method Requested: Fax to Local Pharmacy Initial call taken by: Glynda Jaeger,  October 07, 2009 2:54 PM    Prescriptions: PRADAXA 150 MG CAPS (DABIGATRAN ETEXILATE MESYLATE) one by mouth bid  #180 x 3   Entered by:   Laurance Flatten CMA   Authorized by:   Hillis Range, MD   Signed by:   Laurance Flatten CMA on 10/08/2009   Method used:   Printed then faxed to ...       [facility]       [address]       [place], [place]  [address]       Ph: [phone]       Fax: [phone]   RxID:   [id number]

## 2010-03-20 NOTE — Medication Information (Signed)
Summary: Coumadin Clinic  Anticoagulant Therapy  Managed by: Inactive Referring MD: Clifton James PCP: Dr. Tonie Griffith Supervising MD: Tenny Craw MD, Gunnar Fusi Indication 1: Atrial Fibrillation Lab Used: [facility] [place] Site: [address] INR RANGE 2.0-3.0          Comments: Pt is on Pradaxa, off Coumadin. Cloyde Reams RN  November 08, 2009 10:47 AM   Allergies: 1)  ! Simvastatin (Simvastatin)  Anticoagulation Management History:      Negative risk factors for bleeding include an age less than 36 years old.  The bleeding index is 'low risk'.  Positive CHADS2 values include History of HTN.  Negative CHADS2 values include Age > 68 years old.  His last INR was 1.8 ratio.  Anticoagulation responsible provider: Tenny Craw MD, Gunnar Fusi.  Exp: 11/2010.    Anticoagulation Management Assessment/Plan:      The target INR is 2.0-3.0.  The next INR is due 09/23/2009.  Anticoagulation instructions were given to patient.  Results were reviewed/authorized by Inactive.         Prior Anticoagulation Instructions: INR 1.8  Take 1.5 tabs today and then change to 1 tab on Monday, Wednesday, and Friday and 1/2 tab all other days.  Re-check in 2 weeks.

## 2010-03-20 NOTE — Medication Information (Signed)
Summary: rov/cb  Anticoagulant Therapy  Managed by: Bethena Midget, RN, BSN Referring MD: Clifton James PCP: Dr. Tonie Griffith Supervising MD: Eden Emms MD, Theron Arista Indication 1: Atrial Fibrillation Lab Used: [facility] [place] Site: [address] INR POC 3.3 INR RANGE 2.0-3.0  Dietary changes: yes       Details: Ate less green leafy veggies.   Health status changes: no    Bleeding/hemorrhagic complications: no    Recent/future hospitalizations: no    Any changes in medication regimen? no    Recent/future dental: no  Any missed doses?: no       Is patient compliant with meds? yes      Comments: Pending Ablation on 08/14/09.   Allergies: 1)  ! Simvastatin (Simvastatin)  Anticoagulation Management History:      The patient is taking warfarin and comes in today for a routine follow up visit.  Negative risk factors for bleeding include an age less than 49 years old.  The bleeding index is 'low risk'.  Positive CHADS2 values include History of HTN.  Negative CHADS2 values include Age > 76 years old.  Anticoagulation responsible provider: Eden Emms MD, Theron Arista.  INR POC: 3.3.  Cuvette Lot#: [id number].  Exp: 09/2010.    Anticoagulation Management Assessment/Plan:      The patient's current anticoagulation dose is Coumadin 5 mg tabs: once daily [facility] [place].  The target INR is 2.0-3.0.  The next INR is due 07/29/2009.  Anticoagulation instructions were given to patient.  Results were reviewed/authorized by Bethena Midget, RN, BSN.  He was notified by Bethena Midget, RN, BSN.         Prior Anticoagulation Instructions: INR 2.7. Take 0.5 tablet daily except 1 tablet on Mon and Fri.  Current Anticoagulation Instructions: INR 3.3 Continue 2.5mg s daily except 5mg s on Mondays and Fridays. Recheck in one week.

## 2010-03-20 NOTE — Medication Information (Signed)
Summary: rov/tm  Anticoagulant Therapy  Managed by: Weston Brass, PharmD Referring MD: Clifton James PCP: Dr. Tonie Griffith Supervising MD: Gala Romney MD, Reuel Boom Indication 1: Atrial Fibrillation Lab Used: [facility] [place] Site: [address] INR POC 2.9 INR RANGE 2.0-3.0  Dietary changes: no    Health status changes: no    Bleeding/hemorrhagic complications: no    Recent/future hospitalizations: no    Any changes in medication regimen? no    Recent/future dental: no  Any missed doses?: no        Comments: pt states had 4 beers last noc  Current Medications (verified): 1)  Aspir-Low 81 Mg Tbec (Aspirin) .... Once Daily 2)  Crestor 20 Mg Tabs (Rosuvastatin Calcium) .... Once Daily 3)  Lisinopril 10 Mg Tabs (Lisinopril) .... Once Daily 4)  Niaspan 500 Mg Cr-Tabs (Niacin (Antihyperlipidemic)) .... Once Daily 5)  Coumadin 5 Mg Tabs (Warfarin Sodium) .... Once Daily [facility] [place] 6)  Levitra 20 Mg Tabs (Vardenafil Hcl) .... Prn 7)  Omeprazole 20 Mg Cpdr (Omeprazole) .... Prn  Allergies (verified): 1)  ! Simvastatin (Simvastatin)  Anticoagulation Management History:      The patient is taking warfarin and comes in today for a routine follow up visit.  Negative risk factors for bleeding include an age less than 22 years old.  The bleeding index is 'low risk'.  Positive CHADS2 values include History of HTN.  Negative CHADS2 values include Age > 49 years old.  Anticoagulation responsible provider: Bensimhon MD, Reuel Boom.  INR POC: 2.9.  Cuvette Lot#: [id number].  Exp: 06/2010.    Anticoagulation Management Assessment/Plan:      The patient's current anticoagulation dose is Coumadin 5 mg tabs: once daily [facility] in [place].  The target INR is 2.0-3.0.  The next INR is due 06/27/2009.  Anticoagulation instructions were given to patient.  Results were reviewed/authorized by Weston Brass, PharmD.         Prior Anticoagulation Instructions: INR 3.0 Continue 1/2  pill everyday except 1 pill on Mondays and Fridays. Recheck in one week.   Current Anticoagulation Instructions: INR 2.9  CONTINUE PRESENT DOSING SCHEDULE COUMADIN 1 TAB ON MONDAY,FRIDAY  1/2 TABLET ALL OTHER DAYS  Appended Document: Coumadin Clinic    Anticoagulant Therapy  Managed by: Weston Brass, PharmD Referring MD: Clifton James PCP: Dr. Tonie Griffith Supervising MD: Gala Romney MD, Reuel Boom Indication 1: Atrial Fibrillation Lab Used: [facility] [place] Site: [address] INR RANGE 2.0-3.0          Comments: PENDING DCCV WILL SEE NEXT WEEK  Allergies: 1)  ! Simvastatin (Simvastatin)  Anticoagulation Management History:      Negative risk factors for bleeding include an age less than 79 years old.  The bleeding index is 'low risk'.  Positive CHADS2 values include History of HTN.  Negative CHADS2 values include Age > 71 years old.  Anticoagulation responsible provider: Bensimhon MD, Reuel Boom.  Exp: 06/2010.    Anticoagulation Management Assessment/Plan:      The patient's current anticoagulation dose is Coumadin 5 mg tabs: once daily [facility] in [place].  The target INR is 2.0-3.0.  The next INR is due 06/27/2009.  Anticoagulation instructions were given to patient.  Results were reviewed/authorized by Weston Brass, PharmD.         Prior Anticoagulation Instructions: INR 2.9  CONTINUE PRESENT DOSING SCHEDULE COUMADIN 1 TAB ON MONDAY,FRIDAY  1/2 TABLET ALL OTHER DAYS

## 2010-03-20 NOTE — Letter (Signed)
Summary: ELectrophysiology/Ablation Procedure Instructions  [employer], Main Office  [address]   [place], [place] [address]   Phone: [phone]  Fax: [phone]     Electrophysiology/Ablation Procedure Instructions    You are scheduled for a(n) flutter ablation on 11/08/09 at 12:30pm with Dr. Johney Frame.  1.  Please come to the Short Stay Center at [facility] at 10:30am on the day of your procedure.  2.  Come prepared to stay overnight.   Please bring your insurance cards and a list of your medications.  3.  Come to the [place] office on 11/01/09 for lab work.    You do not have to be fasting.  4.  Do not have anything to eat or drink after midnight the night before your procedure.  5.  All of your remaining medications may be taken with a small amount of water.  6.  Educational material received:  Ablation   * Occasionally, EP studies and ablations can become lengthy.  Please make your family aware of this before your procedure starts.  Average time ranges from 2-8 hours for EP studies/ablations.  Your physician will locate your family after the procedure with the results.  * If you have any questions after you get home, please call the office at [phone]. Anselm Pancoast

## 2010-03-20 NOTE — Medication Information (Signed)
Summary: Coumadin Clinic  Anticoagulant Therapy  Managed by: Shelby Dubin, PharmD, BCPS, CPP Referring MD: Clifton James PCP: Dr. Tonie Griffith Supervising MD: Tenny Craw MD, Gunnar Fusi Indication 1: Atrial Fibrillation Lab Used: [facility] [place] Site: [address] INR POC 2.7 INR RANGE 2.0-3.0  Dietary changes: no    Health status changes: no    Bleeding/hemorrhagic complications: no    Recent/future hospitalizations: no    Any changes in medication regimen? no    Recent/future dental: no  Any missed doses?: no       Is patient compliant with meds? yes       Allergies: 1)  ! Simvastatin (Simvastatin)  Anticoagulation Management History:      The patient is taking warfarin and comes in today for a routine follow up visit.  Negative risk factors for bleeding include an age less than 50 years old.  The bleeding index is 'low risk'.  Positive CHADS2 values include History of HTN.  Negative CHADS2 values include Age > 33 years old.  Anticoagulation responsible provider: Tenny Craw MD, Gunnar Fusi.  INR POC: 2.7.  Exp: 06/2010.    Anticoagulation Management Assessment/Plan:      The patient's current anticoagulation dose is Coumadin 5 mg tabs: once daily [facility] [place].  The target INR is 2.0-3.0.  The next INR is due 05/13/2009.  Anticoagulation instructions were given to patient.  Results were reviewed/authorized by Shelby Dubin, PharmD, BCPS, CPP.  He was notified by Shelby Dubin PharmD, BCPS, CPP.         Prior Anticoagulation Instructions: INR 2.2  Continue current dosing schedule of 1 tablet on Monday and Friday and 1/2 tablet all other days.  Return to clinic in 1 week.  Patient pending cardioversion.     Current Anticoagulation Instructions: INR 2.7  Continue 1 tab each Monday and Friday and 0.5 tab other days.

## 2010-03-20 NOTE — Medication Information (Signed)
Summary: rov/sp  Anticoagulant Therapy  Managed by: Weston Brass, PharmD Referring MD: Clifton James PCP: Dr. Tonie Griffith Supervising MD: Gala Romney MD, Reuel Boom Indication 1: Atrial Fibrillation Lab Used: [facility] [place] Site: [address] INR POC 1.9 INR RANGE 2.0-3.0  Dietary changes: no    Health status changes: yes       Details: having TEE today.  Ablation scheduled for next week.   Bleeding/hemorrhagic complications: no    Recent/future hospitalizations: no    Any changes in medication regimen? no    Recent/future dental: no  Any missed doses?: yes     Details: missed dose on Friday.   Is patient compliant with meds? yes       Allergies: 1)  ! Simvastatin (Simvastatin)  Anticoagulation Management History:      The patient is taking warfarin and comes in today for a routine follow up visit.  Negative risk factors for bleeding include an age less than 42 years old.  The bleeding index is 'low risk'.  Positive CHADS2 values include History of HTN.  Negative CHADS2 values include Age > 37 years old.  Anticoagulation responsible provider: Sergio Hobart MD, Reuel Boom.  INR POC: 1.9.  Cuvette Lot#: [id number].  Exp: 10/2010.    Anticoagulation Management Assessment/Plan:      The patient's current anticoagulation dose is Coumadin 5 mg tabs: once daily [facility] [place].  The target INR is 2.0-3.0.  The next INR is due 08/12/2009.  Anticoagulation instructions were given to patient.  Results were reviewed/authorized by Weston Brass, PharmD.  He was notified by Weston Brass PharmD.         Prior Anticoagulation Instructions: INR 2.2  Take 1 1/2 tablets today then resume same dose of 1/2 tablet every day except 1 tablet on Monday and Friday.  Recheck in 1 week.   Current Anticoagulation Instructions: INR 1.9  Take 1 tablet today then increase dose to 1/2 tablet every day except 1 tablet on Monday, Wednesday and Friday.

## 2010-03-20 NOTE — Assessment & Plan Note (Signed)
Summary: [email or web address] [email or web address]   Visit Type:  Initial Consult Referring Provider:  Dr Clifton Reagen Haberman Primary Provider:  Dr. Tonie Griffith  CC:  afib.  History of Present Illness: Mr Lippy is a pleasant 65 yo WM with a h/o chronic persistent atrial fibrillation, CAD, and COPD (apparhently mild) who presents today for EP consultation.  He reports initially being diagnosed with atrial fibrillation in 1980s.  He had increasing frequency of afib and underwent [facility] 1999.  He reports feeling "much better" in sinus rhythm.  He reports maintaining sinus rhythm until 2006 when he had recurrence of afib.  He reports return of SOB and decreased exercise tolerance.  He was initiated on Amiodarone and underwent DCC4/22/11.  During cardioversion, he would return to sinus for 10-15 seconds and then return immediately to atrial fibrillation.  Amkiodarone was therefore discontinued.  He has also had difficulty with bradycardia and therefore metoprolol has been discontinued.  He is chronically anticoagulated with coumadin.  Current Medications (verified): 1)  Aspir-Low 81 Mg Tbec (Aspirin) .... Once Daily 2)  Crestor 40 Mg Tabs (Rosuvastatin Calcium) .... Take 1/2  Tablet By Mouth Daily. 3)  Lisinopril 10 Mg Tabs (Lisinopril) .... Once Daily 4)  Niaspan 500 Mg Cr-Tabs (Niacin (Antihyperlipidemic)) .... Once Daily 5)  Coumadin 5 Mg Tabs (Warfarin Sodium) .... Once Daily Va in [place] 6)  Levitra 20 Mg Tabs (Vardenafil Hcl) .... Prn 7)  Omeprazole 20 Mg Cpdr (Omeprazole) .... Prn  Allergies: 1)  ! Simvastatin (Simvastatin)  Past History:  Past Medical History: CAD s/p stents 1999, 2001-FFR of LAD 0.85 in March 2010, stress normal 3/11.  HTN Hyperlipidemia Chronic atrial fibrillation on coumadin therapy-failed cardioversion 06/06/09. COPD-? severity CVA-1997 DM (borderline, per pt) PVD Mitral insufficiency Former tobacco abuse  Past Surgical History: Reviewed history from 04/16/2009 and  no changes required. Cholecystectomy Appendectomy Traumatic thumb injury s/p repair Bilateral knee arthroscopic procedures  Family History: Reviewed history from 04/16/2009 and no changes required. Mother-deceased, Alzheimers Father-deceased, CAD/Pacemaker Sister alive  Social History: Pt lives in [place], with a friend. Former tobacco abuse, quit 1999 Alcohol-12 pack per week No illicit drugs Disability Divorced 3 children  Review of Systems       All systems are reviewed and negative except as listed in the HPI.   wears a hearing aide,  snores  Vital Signs:  Patient profile:   65 year old male Height:      73 inches Weight:      217 pounds BMI:     28.73 Pulse rate:   62 / minute BP sitting:   110 / 80  (left arm)  Vitals Entered By: Laurance Flatten CMA (Jul 08, 2009 8:37 AM)  Physical Exam  General:  Well developed, well nourished, in no acute distress. Head:  normocephalic and atraumatic Eyes:  PERRLA/EOM intact; conjunctiva and lids normal. Nose:  no deformity, discharge, inflammation, or lesions Mouth:  Teeth, gums and palate normal. Oral mucosa normal. Neck:  Neck supple, no JVD. No masses, thyromegaly or abnormal cervical nodes. Lungs:  Clear bilaterally to auscultation and percussion. Heart:  iRRR, no m/r/g Abdomen:  Bowel sounds positive; abdomen soft and non-tender without masses, organomegaly, or hernias noted. No hepatosplenomegaly. Msk:  Back normal, normal gait. Muscle strength and tone normal. Pulses:  pulses normal in all 4 extremities Extremities:  No clubbing or cyanosis. Neurologic:  Alert and oriented x 3. Skin:  Intact without lesions or rashes. Cervical Nodes:  no significant adenopathy Psych:  Normal affect.  Nuclear Study  Procedure date:  04/23/2009  Findings:      Excerise capacity: Lexiscan  Blood Pressure response: Normal blood pressure response  Clinical symptoms: Dyspnea  ECG impression: No significant ST segment change  suggestive of ischemia  Overall impression: Normal stress nuclear study.  Charlton Haws, MD, [facility]     Echocardiogram  Procedure date:  03/25/2009  Findings:        Study Conclusions    - Left ventricle: Systolic function was normal. The estimated     ejection fraction was in the range of 55% to 60%. The study is not     technically sufficient to allow evaluation of LV diastolic     function.   - Mitral valve: Mild to moderate regurgitation.   - Left atrium: The atrium was mildly dilated.   - Right ventricle: Systolic pressure was increased.   - Pulmonary arteries: Systolic pressure was mildly increased. PA     peak pressure: 37mm Hg (S).   Transthoracic echocardiography. M-mode, complete 2D, spectral   Doppler, and color Doppler. Height: Height: 185.4cm. Height: 73in.   Weight: Weight: 99.4kg. Weight: 218.7lb. Body mass index: BMI:   28.9kg/m^2. Body surface area: BSA: 2.48m^2. Patient status:   Inpatient. Location: ICU/CCU  Carotid Doppler  Procedure date:  03/25/2009  Findings:        Summary:   No significant extracranial carotid artery stenosis demonstrated.   Veterbrals are patent with antegrade flow.   Other specific details can be found in the table(s) above.  Prepared   and Electronically Authenticated by    Fabienne Bruns, MD   [id number]:[id number]   CT Scan  Procedure date:  03/27/2009  Findings:       Clinical Data: Follow-up closed head injury.    CT HEAD WITHOUT CONTRAST    Technique:  Contiguous axial images were obtained from the base of   the skull through the vertex without contrast.    Comparison: 03/24/2008.  03/25/2008.    Findings: Small hemorrhagic contusion in the lateral right temporal   lobe is no larger.  Very minimal regional edema.  No mass effect or   shift.  Small amount of subdural blood previously noted bilaterally   is resolved.  No areas of increased in bleeding.  No hydrocephalus.   No sign of ischemic  infarction.    IMPRESSION:   Small hemorrhagic contusion right lateral temporal lobe.  No   increase in size, edema or mass effect.  Resolution of previously   seen thin subdural hematomas.    Read By:  Thomasenia Sales,  M.D.   Released By:  Thomasenia Sales,  M.D.   CT Scan  Procedure date:  03/25/2009  Findings:        Clinical Data: Trauma with intracranial hemorrhage after a fall.    CT HEAD WITHOUT CONTRAST    Technique:  Contiguous axial images were obtained from the base of   the skull through the vertex without contrast.    Comparison: 03/24/2009 most recent    Findings: Tiny left frontal subdural hematoma has resolved.  Small   parenchymal shearing injury in the right temporal lobe with cross-   sectional measurements of 4 x 8 mm is redemonstrated and is   probably slightly larger, and more prominent.  There appears to be   mild surrounding temporal lobe edema.  There is no midline shift,   and no new hemorrhages are seen.  There is no significant acute   sinus  or mastoid disease.    Baseline atrophy with chronic microvascular ischemic changes noted.   Calvarium remains intact.    IMPRESSION:   Resolved left frontal extra-axial hematoma.    Slight increased size and prominence right posterior temporal lobe   shearing injury, with measurements of 4 x 8 mm.  Continued   surveillance is warranted.    Read By:  Elsie Stain,  M.D.   Released By:  Elsie Stain,  M.D.      EKG  Procedure date:  06/11/2009  Findings:      afib, V rate 66 bpm, incomplete RBBB  Impression & Recommendations:  Problem # 1:  ATRIAL FIBRILLATION (ICD-427.31) The patient presents today for EP consultation regarding his afib.  He has very symptomatic persistent atrial fibrillation refractory to medical therapy with amiodarone and metoprolol.  His LA size is 44mm.  Therapeutic strategies for afib including medicine and ablation were discussed in detail with the patient today.  Risk, benefits, and alternatives to EP study and radiofrequency ablation for afib were also discussed in detail today. These risks include but are not limited to stroke, bleeding, vascular damage, tamponade, perforation, damage to the esophagus, lungs, and other structures, pulmonary vein stenosis, worsening renal function, and death. The patient understands these risk.  He also understands that the anticipated success rates are 50-60% are will likely require more than one procedure.  He wishes to proceed.  We will therefore schedule cathter ablation at the next available time.  Problem # 2:  HYPERLIPIDEMIA-MIXED (ICD-272.4) stable His updated medication list for this problem includes:    Crestor 40 Mg Tabs (Rosuvastatin calcium) .Marland Kitchen... Take 1/2  tablet by mouth daily.    Niaspan 500 Mg Cr-tabs (Niacin (antihyperlipidemic)) ..... Once daily  Problem # 3:  HYPERTENSION, BENIGN (ICD-401.1) stable His updated medication list for this problem includes:    Aspir-low 81 Mg Tbec (Aspirin) ..... Once daily    Lisinopril 10 Mg Tabs (Lisinopril) ..... Once daily  Problem # 4:  CAD, NATIVE VESSEL (ICD-414.01) stable  His updated medication list for this problem includes:    Aspir-low 81 Mg Tbec (Aspirin) ..... Once daily    Lisinopril 10 Mg Tabs (Lisinopril) ..... Once daily    Coumadin 5 Mg Tabs (Warfarin sodium) ..... Once daily va in [place]

## 2010-03-20 NOTE — Assessment & Plan Note (Signed)
Summary: 1 month rov./sl   Visit Type:  1 month follow up Primary Provider:  Dr. Tonie Griffith   History of Present Illness: 65 yo WM with history of CAD with MI 1999 with several stents placed at that time and subsequent stent placement 2001 at the [facility] in [place], COPD, DM, hyperlipidemia, CVA, PVD and chronic atrial fibrillation on coumadin therapy  here today for folllow up. I saw him as a new patient two weeks ago.   He has a complex medical history. He has been treated for 10 years at the [facility] for COPD. It sounds like he was admitted to [facility] with chest pain and CHF in March 2010. He had a left heart cath which is unavailable at this time. I do see a report of an FFR of the LAD which was 0.85 on 05/14/08. He was told by a pulmonologist at that time that his lungs were ok. He has been in atrial fibrillation for many years and has his coumadin followed at the [facility], last check 1/11. He had a cardioversion many years ago by Dr. Corinda Gubler. This spring, he was having a beer with his friend and passed out. He sustained a subdural hematoma as a result of the fall. He was admitted to [facility] and was seen by Dr. Myrtis Ser. His cardiac enzymes were negative. He had some wide complex tachycardia per consult note. He is mostly limited by fatigue and SOB. He can only walk a short distance before becoming profoundly dyspneic.  He has been followed in the [facility] for his cardiac issues. He has also been seen by Dr. Kassie Mends in [facility], most recently 4/09. As above, cath 3/10 at [facility] with stable disease. When I saw him, I arranged for a nuclear stress test and had him wear a 21 day event monitor. On 04/25/09, he had a 3 second pause. His metoprolol was held at that time. The following day, he had a run of atrial fib with RVR, heart rates in the 150s-160s and was asymptomatic during both the pause and the rapid rate. He is never aware of  irregularities of his heart rhythm. Stress test showed no ischemia.  He has had therapeutic INRs for the last five weeks. He continues to have fatigue. No chest pain. His breathing has been better off of the beta blocker.   Current Medications (verified): 1)  Aspir-Low 81 Mg Tbec (Aspirin) .... Once Daily 2)  Crestor 20 Mg Tabs (Rosuvastatin Calcium) .... Once Daily 3)  Lisinopril 10 Mg Tabs (Lisinopril) .... Once Daily 4)  Niaspan 500 Mg Cr-Tabs (Niacin (Antihyperlipidemic)) .... Once Daily 5)  Coumadin 5 Mg Tabs (Warfarin Sodium) .... Once Daily [facility] in [place] 6)  Levitra 20 Mg Tabs (Vardenafil Hcl) .... Prn 7)  Omeprazole 20 Mg Cpdr (Omeprazole) .... Prn  Allergies: 1)  ! Simvastatin (Simvastatin)  Past History:  Past Medical History: Reviewed history from 04/16/2009 and no changes required. CAD s/p stents 1999, 2001 HTN Hyperlipidemia Chronic atrial fibrillation on coumadin therapy COPD CVA-1997 DM PVD Mitral insufficiency Former tobacco abuse  Social History: Reviewed history from 04/16/2009 and no changes required. Former tobacco abuse, quit 1999 Alcohol-12 pack per week No illicit drugs Disability Divorced 3 children  Review of Systems       The patient complains of fatigue.  The patient denies malaise, fever, weight gain/loss, vision loss, decreased hearing, hoarseness, chest pain, palpitations, shortness of breath, prolonged cough, wheezing, sleep apnea,  coughing up blood, abdominal pain, blood in stool, nausea, vomiting, diarrhea, heartburn, incontinence, blood in urine, muscle weakness, joint pain, leg swelling, rash, skin lesions, headache, fainting, dizziness, depression, anxiety, enlarged lymph nodes, easy bruising or bleeding, and environmental allergies.    Vital Signs:  Patient profile:   65 year old male Height:      73 inches Weight:      214 pounds BMI:     28.34 Pulse rate:   77 / minute Pulse rhythm:   irregular Resp:     18 per  minute BP sitting:   110 / 80  (left arm) Cuff size:   large  Vitals Entered By: Vikki Ports (May 30, 2009 9:57 AM) Comments INR today = 2.9   Physical Exam  General:  General: Well developed, well nourished, NAD Musculoskeletal: Muscle strength 5/5 all ext Psychiatric: Mood and affect normal Neck: No JVD, no carotid bruits, no thyromegaly, no lymphadenopathy. Lungs:Clear bilaterally, no wheezes, rhonci, crackles CV: Irregular. No murmurs, gallops rubs Abdomen: soft, NT, ND, BS present Extremities: No edema, pulses 1+.    EKG  Procedure date:  05/30/2009  Findings:      Atrial fibrillation, rate 77 bpm.   Impression & Recommendations:  Problem # 1:  ATRIAL FIBRILLATION (ICD-427.31)  He has been therapeutic now for 4 weeks on coumadin as documented in the EMR. He had pauses on beta blocker therapy so this was stopped. I will start amiodarone 200 mg by mouth two times a day today and arrange elective cardioversion next week at [facility]. My goal will be to maintain NSR if possible. If he does not maintain NSR on amiodarone after DCCV, will refer to EP for further management.  Will get baseline TSH and LFTs today. Will check BMET before DCCV.  His updated medication list for this problem includes:    Aspir-low 81 Mg Tbec (Aspirin) ..... Once daily    Coumadin 5 Mg Tabs (Warfarin sodium) ..... Once daily va in [place]    Amiodarone Hcl 200 Mg Tabs (Amiodarone hcl) .Marland Kitchen... Take one tablet by mouth twice a day  Orders: TLB-BMP (Basic Metabolic Panel-BMET) (80048-METABOL) TLB-Hepatic/Liver Function Pnl (80076-HEPATIC) TLB-TSH (Thyroid Stimulating Hormone) (84443-TSH) EKG w/ Interpretation (93000) Cardioversion (Cardioversion)  Patient Instructions: 1)  Your physician recommends that you schedule a follow-up appointment in: 6 weeks 2)  Your physician has recommended you make the following change in your medication: Start amiodarone 200 mg by mouth two  times a day 3)  Your physician has recommended that you have a cardioversion (DCCV).  Electrical cardioversion uses a jolt of electricity to your heart either through paddles or wired patches attached to your chest. This is a controlled, usually prescheduled, procedure. Defibrillation is done under light anesthesia in the hospital, and you usually go home the day of the procedure. This is done to get your heart back into a normal rhythm. You are not awake for the procedure. Please see the instruction sheet given to you today. Prescriptions: AMIODARONE HCL 200 MG TABS (AMIODARONE HCL) Take one tablet by mouth twice a day  #60 x 3   Entered by:   Dossie Arbour, RN, BSN   Authorized by:   Verne Carrow, MD   Signed by:   Dossie Arbour, RN, BSN on 05/30/2009   Method used:   Electronically to        [facility] (retail)       [address]  [place], [place]  [address]       Ph: [phone]       Fax: [phone]   RxID:   [id number]

## 2010-03-20 NOTE — Medication Information (Signed)
Summary: Mike Campos  Anticoagulant Therapy  Managed by: Bethena Midget, RN, BSN Referring MD: Clifton James PCP: Dr. Tonie Griffith Supervising MD: Tenny Craw MD, Gunnar Fusi Indication 1: Atrial Fibrillation Lab Used: [facility] [place] Site: [address] INR POC 3.0 INR RANGE 2.0-3.0  Dietary changes: no    Health status changes: no    Bleeding/hemorrhagic complications: no    Recent/future hospitalizations: no    Any changes in medication regimen? no    Recent/future dental: no  Any missed doses?: no       Is patient compliant with meds? yes      Comments: Pending DCCV, sees Dr. Sanjuana Kava on 05/30/09.  Allergies: 1)  ! Simvastatin (Simvastatin)  Anticoagulation Management History:      The patient is taking warfarin and comes in today for a routine follow up visit.  Negative risk factors for bleeding include an age less than 50 years old.  The bleeding index is 'low risk'.  Positive CHADS2 values include History of HTN.  Negative CHADS2 values include Age > 69 years old.  Anticoagulation responsible provider: Tenny Craw MD, Gunnar Fusi.  INR POC: 3.0.  Cuvette Lot#: [id number].  Exp: 06/2010.    Anticoagulation Management Assessment/Plan:      The patient's current anticoagulation dose is Coumadin 5 mg tabs: once daily [facility] in [place].  The target INR is 2.0-3.0.  The next INR is due 05/30/2009.  Anticoagulation instructions were given to patient.  Results were reviewed/authorized by Bethena Midget, RN, BSN.  He was notified by Bethena Midget, RN, BSN.         Prior Anticoagulation Instructions: INR 3.8  Take 1/2 tablet today then resume same dosage 1/2 tablet daily except 1 tablet on Mondays and Fridays.  Recheck in 1 week.    Current Anticoagulation Instructions: INR 3.0 Continue 1/2 pill everyday except 1 pill on Mondays and Fridays. Recheck in one week.

## 2010-03-20 NOTE — Assessment & Plan Note (Signed)
Summary: per check out/sf   Visit Type:  Follow-up Referring Provider:  Dr Clifton Nautica Hotz Primary Provider:  Dr. Tonie Griffith   History of Present Illness: The patient presents today for electrophysiology followup. He has had EFAF post ablation.  He reports dypsnea and fatigue.  He reports mild nausea with multaq but notes that this is "better than the side effects of amiodarone". The patient denies symptoms of palpitations, chest pain, orthopnea, PND, lower extremity edema, dizziness, presyncope, syncope, or neurologic sequela. The patient is tolerating medications without difficulties and is otherwise without complaint today.    Current Medications (verified): 1)  Aspir-Low 81 Mg Tbec (Aspirin) .... Once Daily 2)  Crestor 20 Mg Tabs (Rosuvastatin Calcium) .... Take One Tab By Mouth Once Daily 3)  Lisinopril 10 Mg Tabs (Lisinopril) .... Once Daily 4)  Niaspan 500 Mg Cr-Tabs (Niacin (Antihyperlipidemic)) .... Once Daily 5)  Coumadin 5 Mg Tabs (Warfarin Sodium) .... [place] 6)  Levitra 20 Mg Tabs (Vardenafil Hcl) .... Prn 7)  Omeprazole 40 Mg Cpdr (Omeprazole) .... Prn 8)  Multaq 400 Mg Tabs (Dronedarone Hcl) .... Take One Tab By Mouth Two Times A Day 9)  Avodart 0.5 Mg Caps (Dutasteride) .... Take One Capsule By Mouth At Bedtime 10)  Furosemide 40 Mg Tabs (Furosemide) .... One By Mouth Daily  Allergies: 1)  ! Simvastatin (Simvastatin)  Past History:  Past Medical History: Reviewed history from 08/26/2009 and no changes required. CAD s/p stents 1999, 2001-FFR of LAD 0.85 in March 2010, stress normal 3/11.  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 Cholecystectomy Appendectomy Traumatic thumb injury s/p repair Bilateral knee arthroscopic procedures s/p afib  ablation 6/11  Social History: Reviewed history from 07/08/2009 and no changes required. Pt lives in [place], with a friend. Former tobacco abuse, quit 1999 Alcohol-12 pack per week No illicit drugs Disability Divorced 3 children  Review of Systems       All systems are reviewed and negative except as listed in the HPI.   Vital Signs:  Patient profile:   65 year old male Height:      73 inches Weight:      213 pounds BMI:     28.20 Pulse rate:   74 / minute BP supine:   130 / 82  (left arm)  Vitals Entered By: Laurance Flatten CMA (September 06, 2009 8:28 AM)  Physical Exam  General:  Well developed, well nourished, in no acute distress. Head:  normocephalic and atraumatic Eyes:  PERRLA/EOM intact; conjunctiva and lids normal. Mouth:  Teeth, gums and palate normal. Oral mucosa normal. Neck:  Neck supple, no JVD. No masses, thyromegaly or abnormal cervical nodes. Lungs:  Clear bilaterally to auscultation and percussion. Heart:  iRRR, no m/r/g Abdomen:  Bowel sounds positive; abdomen soft and non-tender without masses, organomegaly, or hernias noted. No hepatosplenomegaly. Msk:  Back normal, normal gait. Muscle strength and tone normal. Pulses:  pulses normal in all 4 extremities Extremities:  No clubbing or cyanosis. Neurologic:  Alert and oriented x 3.   EKG  Procedure date:  09/06/2009  Findings:      afib,  V rates 70s, inferior infarction  Impression & Recommendations:  Problem # 1:  ATRIAL FIBRILLATION (ICD-427.31) ERAF s/p ablation. We will continue multaq and plan cardioversion.  As his INR has been recently subtherapeutic, I will  stop coumadin and start pradaxa today.  We will procced with cardioversion in 3 weeks. If he has further ERAF afterwards, we should consider tikosyn.  Problem # 2:  SNORING (ICD-786.09) sleep study reviewed and reveals mild sleep apnea weight loss is advised  Problem # 3:  HYPERTENSION, BENIGN (ICD-401.1) stable stop  lasix  Problem # 4:  CAD, NATIVE VESSEL (ICD-414.01) no symptoms of ischemia  Other Orders: EKG w/ Interpretation (93000)  Patient Instructions: 1)  Your physician has recommended you make the following change in your medication: stop Coumadin and Furosemide and start Pradaxa 150mg  two times a day 2)  Your physician has recommended that you have a cardioversion (DCCV).  Electrical cardioversion uses a jolt of electricity to your heart either through paddles or wired patches attached to your chest. This is a controlled, usually prescheduled, procedure. Defibrillation is done under light anesthesia in the hospital, and you usually go home the day of the procedure. This is done to get your heart back into a normal rhythm. You are not awake for the procedure. Please see the instruction sheet given to you today. Will schedule this in three weeks(I will call you) Prescriptions: PRADAXA 150 MG CAPS (DABIGATRAN ETEXILATE MESYLATE) one by mouth bid  #60 x 3   Entered by:   Joanthan Bast, RN, BSN   Authorized by:   Hillis Range, MD   Signed by:   Hikeem Bast, RN, BSN on 09/06/2009   Method used:   Electronically to        [facility] (retail)       [address]       [place], [place]  [address]       Ph: [phone]       Fax: [phone]   RxID:   [id number]

## 2010-03-20 NOTE — Assessment & Plan Note (Signed)
Summary: per check out/saf   Referring Provider:  Dr Clifton Imara Standiford Primary Provider:  Dr. Tonie Griffith   History of Present Illness: The patient presents today for electrophysiology followup. He remains in afib post ablation 6/11. I tried to cardiovert him 8/11 post ablation and was unsuccessful.  He reports ongoing dypsnea and fatigue.  He is presently not on an antiarrhythmic drug after failing both multaq and amiodarone.  The patient denies symptoms of palpitations,  orthopnea, PND, lower extremity edema, dizziness, presyncope, syncope, or neurologic sequela.  He reports having brief chest pain back in december while at the beach which he attributes to indigestion.  He denies any further chest pain.  He has had a URI for several days for which he was evaluated at the [place].  He was placed on a decongestant, though he is not sure which one.  He reports brief orthostatic dizziness this AM, now resolved.  The patient is tolerating medications without difficulties and is otherwise without complaint today.   Lipid Management History:      Positive NCEP/ATP III risk factors include male age 50 years old or older, hypertension, and ASHD (either angina/prior MI/prior CABG).     Current Medications (verified): 1)  Aspir-Low 81 Mg Tbec (Aspirin) .... Once Daily 2)  Crestor 20 Mg Tabs (Rosuvastatin Calcium) .... Take One Tab By Mouth Once Daily 3)  Lisinopril 10 Mg Tabs (Lisinopril) .... Once Daily 4)  Niaspan 500 Mg Cr-Tabs (Niacin (Antihyperlipidemic)) .... Once Daily 5)  Pradaxa 150 Mg Caps (Dabigatran Etexilate Mesylate) .... One By Mouth Two Times A Day 6)  Levitra 20 Mg Tabs (Vardenafil Hcl) .... Prn 7)  Omeprazole 40 Mg Cpdr (Omeprazole) .... Prn 8)  Avodart 0.5 Mg Caps (Dutasteride) .... Take One Capsule By Mouth At Bedtime  Allergies (verified): 1)  ! Simvastatin (Simvastatin)  Past History:  Past Medical History: Reviewed history from 08/26/2009 and no changes required. CAD s/p stents 1999,  2001-FFR of LAD 0.85 in March 2010, stress normal 3/11.  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 Cholecystectomy Appendectomy Traumatic thumb injury s/p repair Bilateral knee arthroscopic procedures s/p afib ablation 6/11  Social History: Reviewed history from 07/08/2009 and no changes required. Pt lives in [place], with a friend. Former tobacco abuse, quit 1999 Alcohol-12 pack per week No illicit drugs Disability Divorced 3 children  Review of Systems       All systems are reviewed and negative except as listed in the HPI.   Vital Signs:  Patient profile:   65 year old male Height:      73 inches Weight:      221 pounds Pulse (ortho):   96 / minute BP sitting:   132 / 100  (left arm) BP standing:   120 / 70  Vitals Entered By: Laurance Flatten CMA (February 24, 2010 10:50 AM)  Serial Vital Signs/Assessments:  Time      Position  BP       Pulse  Resp  Temp     By           Lying LA  130/92   97                    Jewel Hardy CMA           Sitting  130/90   97                    Jewel Hardy CMA           Standing  120/70   96                    Jewel Hardy CMA           Standing  110/70   96                    Laurance Flatten CMA           Standing  120/72   97                    Jewel Hardy CMA  Comments: Pt was light headed thoughout test. By: Laurance Flatten CMA    Physical Exam  General:  Well developed, well nourished, in no acute distress. Head:  normocephalic and atraumatic Eyes:  PERRLA/EOM intact; conjunctiva and lids normal. Mouth:  Teeth, gums and palate normal. Oral mucosa normal. Neck:  Neck supple, no JVD. No masses, thyromegaly or abnormal cervical nodes. Lungs:  Clear bilaterally to auscultation and percussion. Heart:   iRRR, no m/r/g Abdomen:  Bowel sounds positive; abdomen soft and non-tender without masses, organomegaly, or hernias noted. No hepatosplenomegaly. Msk:  Back normal, normal gait. Muscle strength and tone normal. Extremities:  No clubbing or cyanosis. Neurologic:  Alert and oriented x 3. Psych:  Normal affect.   EKG  Procedure date:  02/24/2010  Findings:      afib, V rate 87, incomplete RBBB, inferior infarction  Impression & Recommendations:  Problem # 1:  ATRIAL FIBRILLATION (ICD-427.31) Refractory afib.  He has failed medical therapy with multaq and amiodarone.  He has had recurrent afib s/p ablation 6/11. Therapeutic strategies for afib including medicine and ablation were discussed in detail with the patient today.   At this time, he wishes to defer ablation.  I will therefore continue rate control and anticoagulation with pradaxa. I will start cardizem DC 120mg  daily for rate control.  If he decides to reconsider sinus rhythm, then I would recommend repeat catheter ablation at that time.  Problem # 2:  HYPERTENSION, BENIGN (ICD-401.1) stable add cardizem as above  Problem # 3:  CAD, NATIVE VESSEL (ICD-414.01) stable pt to contact our office if further chest pain occurs continue current medicine  Problem # 4:  HYPERLIPIDEMIA-MIXED (ICD-272.4) stable His updated medication list for this problem includes:    Crestor 20 Mg Tabs (Rosuvastatin calcium) .Marland Kitchen... Take one tab by mouth once daily    Niaspan 500 Mg Cr-tabs (Niacin (antihyperlipidemic)) ..... Once daily  Lipid Assessment/Plan:      Based on NCEP/ATP III, the patient's risk factor category is "history of coronary disease, peripheral vascular disease, cerebrovascular disease, or aortic aneurysm".  The patient's lipid goals are as follows: Total cholesterol goal is 200; LDL cholesterol goal is 100; HDL cholesterol goal is 40; Triglyceride goal is 150.    Patient Instructions: 1)  Your physician recommends that you  schedule a follow-up appointment in: 3 months with Dr Johney Frame 2)  Your physician has recommended you make the following change in your medication: start Cardizem 120mg  daily Prescriptions: CARDIZEM CD 120 MG XR24H-CAP (DILTIAZEM HCL COATED BEADS) one by mouth daily  #30 x 11   Entered by:   Jael Bast, RN, BSN   Authorized by:   Hillis Range, MD  Signed by:   Crescencio Bast, RN, BSN on 02/24/2010   Method used:   Electronically to        [facility] [phone]* (retail)       [address] [place], [place]  [address]       Ph: [phone]       Fax: [phone]   RxID:   [id number] CARDIZEM CD 120 MG XR24H-CAP (DILTIAZEM HCL COATED BEADS) one by mouth daily  #30 x 11   Entered by:   Nikolos Bast, RN, BSN   Authorized by:   Hillis Range, MD   Signed by:   Autrey Bast, RN, BSN on 02/24/2010   Method used:   Electronically to        [facility] (retail)       [address]       [place], [place]  [address]       Ph: [phone]       Fax: [phone]   RxID:   [id number]

## 2010-03-20 NOTE — Progress Notes (Signed)
Summary: questions er med/dental procedure  Phone Note Call from Patient   Caller: Patient [phone] Reason for Call: Talk to Nurse Summary of Call: pt having tooth extraction 9-21-pt on pradaxa and wanted to know if there was anything he needed to do before procedure?-pls advise [phone] Initial call taken by: Glynda Jaeger,  October 28, 2009 12:01 PM  Follow-up for Phone Call        s/p afib ablation on 08/14/09 and DCCV on 10/01/09.  Still on Pradaxa 150mg  two times a day.  Can he stop for a tooth extraction Romain Bast, RN, BSN  October 28, 2009 1:57 PM   per pt calling back to speak to Tonette Lederer  October 30, 2009 10:37 AM  per Dr Johney Frame ok to stop 2 days prior and restart 24 hours after Jaythan Bast, RN, BSN  October 30, 2009 5:21 PM left message on  machine for patient with above information Tanveer Bast, RN, BSN  October 30, 2009 5:23 PM Follow-up by: Hillis Range, MD,  October 30, 2009 8:08 PM     +

## 2010-03-20 NOTE — Progress Notes (Signed)
  Release recieved from Dept [facility] sent to [facility]  November 21, 2009 9:17 AM

## 2010-03-20 NOTE — Letter (Signed)
Summary: Handout Printed  Printed Handout:  - Coumadin Instructions-w/out Meds 

## 2010-03-20 NOTE — Progress Notes (Signed)
Summary: discuss appt on mon  Phone Note Call from Patient Call back at [facility] Phone [phone]   Caller: Patient Reason for Call: Talk to Nurse Details for Reason: Per pt calling, discuss the appt he has on monday. [facility]  Initial call taken by: Lorne Skeens,  May 03, 2009 1:43 PM  Follow-up for Phone Call        Informed pt. that we don't follow outside labs. If [facility] draws [facility] would have to dose lab.  Follow-up by: Bethena Midget, RN, BSN,  May 03, 2009 2:15 PM

## 2010-03-20 NOTE — Consult Note (Signed)
Summary: [facility]   Imported By: Roderic Ovens 04/15/2009 16:07:23  _____________________________________________________________________  External Attachment:    Type:   Image     Comment:   External Document

## 2010-03-20 NOTE — Progress Notes (Signed)
[facility]  Phone Note Call from Patient   Caller: Patient Reason for Call: Talk to Nurse Summary of Call: needs fax [facility]'t [facility] fax [phone] Initial call taken by: Glynda Jaeger,  October 31, 2009 3:37 PM    Prescriptions: PRADAXA 150 MG CAPS (DABIGATRAN ETEXILATE MESYLATE) one by mouth bid  #180 x 3   Entered by:   Jandel Bast, RN, BSN   Authorized by:   Marca Ancona, MD   Signed by:   Travius Bast, RN, BSN on 10/31/2009   Method used:   Print then Give to Patient   RxID:   [id number] PRADAXA 150 MG CAPS (DABIGATRAN ETEXILATE MESYLATE) one by mouth bid  #180 x 3   Entered by:   Saman Bast, RN, BSN   Authorized by:   Marca Ancona, MD   Signed by:   Lennox Bast, RN, BSN on 10/31/2009   Method used:   Handwritten   RxID:   [id number]

## 2010-03-20 NOTE — Progress Notes (Signed)
Summary: QUESTIONS ABOUT MEDS  Phone Note Call from Patient Call back at Home Phone [phone]   Caller: Patient Summary of Call: PT HAVE QUESTION ABOUT MEDICATION Initial call taken by: Judie Grieve,  January 31, 2010 11:32 AM  Follow-up for Phone Call        Pt. said he gets his medications from the [place]. He would like to know if Pradaxa and Dabigatran was the same medication. I assure pt. that it was the same medication. Pt. verbalized understanding. Follow-up by: Ollen Gross, RN, BSN,  January 31, 2010 1:01 PM

## 2010-03-20 NOTE — Cardiovascular Report (Signed)
Summary: [facility] Cardiology Coronary Arteriogram  [facility] Cardiology Coronary Arteriogram   Imported By: Roderic Ovens 04/30/2009 15:17:13  _____________________________________________________________________  External Attachment:    Type:   Image     Comment:   External Document

## 2010-05-02 LAB — BASIC METABOLIC PANEL
CO2: 25 mEq/L (ref 19–32)
Calcium: 9.6 mg/dL (ref 8.4–10.5)
Chloride: 109 mEq/L (ref 96–112)
GFR calc Af Amer: >60 mL/min (ref >60)
Glucose, Bld: 112 mg/dL — ABNORMAL HIGH (ref 70–99)
Potassium: 4.1 mEq/L (ref 3.5–5.1)
Sodium: 142 mEq/L (ref 135–145)

## 2010-05-02 LAB — CBC
HCT: 42.5 % (ref 39.0–52.0)
Hemoglobin: 15.2 g/dL (ref 13.0–17.0)
MCH: 30.9 pg (ref 26.0–34.0)
MCHC: 35.8 g/dL (ref 30.0–36.0)
RBC: 4.92 MIL/uL (ref 4.22–5.81)

## 2010-05-02 LAB — MAGNESIUM: Magnesium: 1.8 mg/dL (ref 1.5–2.5)

## 2010-05-02 LAB — APTT: aPTT: 56 seconds — ABNORMAL HIGH (ref 24–37)

## 2010-05-04 LAB — GLUCOSE, CAPILLARY
Glucose-Capillary: 119 mg/dL — ABNORMAL HIGH (ref 70–99)
Glucose-Capillary: 129 mg/dL — ABNORMAL HIGH (ref 70–99)

## 2010-05-04 LAB — PROTIME-INR
Prothrombin Time: 27.4 seconds — ABNORMAL HIGH (ref 11.6–15.2)
Prothrombin Time: 28.9 seconds — ABNORMAL HIGH (ref 11.6–15.2)

## 2010-05-06 LAB — GLUCOSE, CAPILLARY: Glucose-Capillary: 147 mg/dL — ABNORMAL HIGH (ref 70–99)

## 2010-05-06 LAB — CBC
Platelets: 199 10*3/uL (ref 150–400)
RBC: 4.91 MIL/uL (ref 4.22–5.81)
WBC: 3.7 10*3/uL — ABNORMAL LOW (ref 4.0–10.5)

## 2010-05-08 LAB — CBC
HCT: 35.8 % — ABNORMAL LOW (ref 39.0–52.0)
HCT: 37.5 % — ABNORMAL LOW (ref 39.0–52.0)
HCT: 43.9 % (ref 39.0–52.0)
Hemoglobin: 12.7 g/dL — ABNORMAL LOW (ref 13.0–17.0)
Hemoglobin: 13.3 g/dL (ref 13.0–17.0)
Hemoglobin: 15.4 g/dL (ref 13.0–17.0)
MCHC: 35.3 g/dL (ref 30.0–36.0)
MCV: 91 fL (ref 78.0–100.0)
Platelets: 167 10*3/uL (ref 150–400)
RBC: 3.96 MIL/uL — ABNORMAL LOW (ref 4.22–5.81)
RBC: 4.82 MIL/uL (ref 4.22–5.81)
RDW: 13.5 % (ref 11.5–15.5)
WBC: 5.2 10*3/uL (ref 4.0–10.5)
WBC: 5.6 10*3/uL (ref 4.0–10.5)

## 2010-05-08 LAB — PREPARE FRESH FROZEN PLASMA

## 2010-05-08 LAB — CARDIAC PANEL(CRET KIN+CKTOT+MB+TROPI): Relative Index: 1.8 (ref 0.0–2.5)

## 2010-05-08 LAB — COMPREHENSIVE METABOLIC PANEL
Albumin: 4 g/dL (ref 3.5–5.2)
Alkaline Phosphatase: 67 U/L (ref 39–117)
BUN: 11 mg/dL (ref 6–23)
Chloride: 103 mEq/L (ref 96–112)
Glucose, Bld: 144 mg/dL — ABNORMAL HIGH (ref 70–99)
Potassium: 3.9 mEq/L (ref 3.5–5.1)
Total Bilirubin: 2.8 mg/dL — ABNORMAL HIGH (ref 0.3–1.2)

## 2010-05-08 LAB — POCT CARDIAC MARKERS: Troponin i, poc: <0.05 ng/mL (ref 0.00–0.09)

## 2010-05-08 LAB — ABO/RH: ABO/RH(D): A POS

## 2010-05-08 LAB — POCT I-STAT, CHEM 8
BUN: 15 mg/dL (ref 6–23)
Chloride: 110 mEq/L (ref 96–112)
Potassium: 6.5 mEq/L (ref 3.5–5.1)
Sodium: 135 mEq/L (ref 135–145)
TCO2: 23 mmol/L (ref 0–100)

## 2010-05-08 LAB — BASIC METABOLIC PANEL
CO2: 30 mEq/L (ref 19–32)
Calcium: 8.9 mg/dL (ref 8.4–10.5)
Chloride: 105 mEq/L (ref 96–112)
GFR calc non Af Amer: >60 mL/min (ref >60)
Glucose, Bld: 128 mg/dL — ABNORMAL HIGH (ref 70–99)
Potassium: 5.3 mEq/L — ABNORMAL HIGH (ref 3.5–5.1)
Sodium: 135 mEq/L (ref 135–145)
Sodium: 137 mEq/L (ref 135–145)

## 2010-05-08 LAB — DIFFERENTIAL
Eosinophils Relative: 2 % (ref 0–5)
Lymphocytes Relative: 21 % (ref 12–46)
Lymphs Abs: 1.1 10*3/uL (ref 0.7–4.0)
Monocytes Absolute: 0.4 10*3/uL (ref 0.1–1.0)
Monocytes Relative: 7 % (ref 3–12)

## 2010-05-08 LAB — PROTIME-INR
Prothrombin Time: 18.7 seconds — ABNORMAL HIGH (ref 11.6–15.2)
Prothrombin Time: 23.4 seconds — ABNORMAL HIGH (ref 11.6–15.2)

## 2010-05-08 LAB — LACTIC ACID, PLASMA: Lactic Acid, Venous: 2.2 mmol/L (ref 0.5–2.2)

## 2010-05-08 LAB — ETHANOL: Alcohol, Ethyl (B): 73 mg/dL — ABNORMAL HIGH (ref 0–10)

## 2010-06-26 ENCOUNTER — Encounter: Payer: Self-pay | Admitting: Internal Medicine

## 2010-06-30 ENCOUNTER — Ambulatory Visit (INDEPENDENT_AMBULATORY_CARE_PROVIDER_SITE_OTHER): Payer: Medicare Other | Admitting: Internal Medicine

## 2010-06-30 ENCOUNTER — Encounter: Payer: Self-pay | Admitting: Internal Medicine

## 2010-06-30 DIAGNOSIS — E785 Hyperlipidemia, unspecified: Secondary | ICD-10-CM

## 2010-06-30 DIAGNOSIS — I2510 Atherosclerotic heart disease of native coronary artery without angina pectoris: Secondary | ICD-10-CM

## 2010-06-30 DIAGNOSIS — I10 Essential (primary) hypertension: Secondary | ICD-10-CM

## 2010-06-30 DIAGNOSIS — I4891 Unspecified atrial fibrillation: Secondary | ICD-10-CM

## 2010-06-30 NOTE — Patient Instructions (Signed)
Your physician wants you to follow-up in: 6 months with Dr Jacquiline Doe will receive a reminder letter in the mail two months in advance. If you don't receive a letter, please call our office to schedule the follow-up appointment.  Also needs to follow up with Dr Clifton James next available

## 2010-06-30 NOTE — Assessment & Plan Note (Signed)
Stable He is overdue to see Dr Clifton Cana Mignano.  We will schedule next available follow-up

## 2010-06-30 NOTE — Assessment & Plan Note (Signed)
He remains in afib post ablation.  He has a difficult case of afib but fortunately, appears to be tolerating rate control at this time.  We discussed repeat catheter ablation and presently, he would like to avoid further procedures unless his afib worsens. His CHADS2 score is 2 (DM,HTN) and he is appropriately anticoagulated with pradaxa.

## 2010-06-30 NOTE — Progress Notes (Signed)
The patient presents today for routine electrophysiology followup.  Since last being seen in our clinic, the patient reports doing very well.  He feels that his energy continues to improve.  He plays golf several times per week without difficulty.  He is tolerating pradaxa. Today, he denies symptoms of  chest pain, shortness of breath, orthopnea, PND, lower extremity edema, dizziness, presyncope, syncope, or neurologic sequela.  The patient feels that he is tolerating medications without difficulties and is otherwise without complaint today.   Past Medical History  Diagnosis Date  . CAD (coronary artery disease)     s/p stents 1999, 2001-FFR of LAD 0.85 in March 2010, stress normal 3/11.   Marland Kitchen HTN (hypertension)   . Hyperlipidemia   . Chronic atrial fibrillation     on coumadin therapy; s/p ablation 07/2009  . COPD (chronic obstructive pulmonary disease)   . CVA (cerebral infarction) 1997  . DM (diabetes mellitus)     borderline-per pt  . PVD (peripheral vascular disease)   . Mitral insufficiency    Past Surgical History  Procedure Date  . Cholecystectomy   . Appendectomy   . Traumatic thumb injury     s/p repair  . Bilateral knee arthroscopic procedures   . Afib ablation 07/2009    Current Outpatient Prescriptions  Medication Sig Dispense Refill  . aspirin (ASPIR-LOW) 81 MG EC tablet Take 81 mg by mouth daily.        . dabigatran (PRADAXA) 150 MG CAPS Take 150 mg by mouth 2 (two) times daily.        Marland Kitchen diltiazem (CARDIZEM CD) 120 MG 24 hr capsule Take 120 mg by mouth daily.        Marland Kitchen dutasteride (AVODART) 0.5 MG capsule Take 0.5 mg by mouth at bedtime.        Marland Kitchen lisinopril (PRINIVIL,ZESTRIL) 10 MG tablet Take 10 mg by mouth daily.        . niacin (NIASPAN) 500 MG CR tablet Take 500 mg by mouth daily.        Marland Kitchen omeprazole (PRILOSEC) 40 MG capsule as needed.        . rosuvastatin (CRESTOR) 20 MG tablet Take 20 mg by mouth daily.        . vardenafil (LEVITRA) 20 MG tablet as needed.            Allergies  Allergen Reactions  . Simvastatin     REACTION: itches    History   Social History  . Marital Status: Divorced    Spouse Name: N/A    Number of Children: 3  . Years of Education: N/A   Occupational History  . Disability    Social History Main Topics  . Smoking status: Former Games developer  . Smokeless tobacco: Not on file   Comment: quit in 1999  . Alcohol Use: Yes     12 pack per week  . Drug Use: No  . Sexually Active: Not on file   Other Topics Concern  . Not on file   Social History Narrative  . No narrative on file    Family History  Problem Relation Age of Onset  . Coronary artery disease Father     pacemaker   Physical Exam: Filed Vitals:   06/30/10 1135  BP: 110/80  Pulse: 94  Height: 6\' 1"  (1.854 m)  Weight: 210 lb (95.255 kg)    GEN- The patient is well appearing, alert and oriented x 3 today.   Head- normocephalic, atraumatic Eyes-  Sclera clear, conjunctiva pink Ears- hearing intact Oropharynx- clear Neck- supple, no JVP Lymph- no cervical lymphadenopathy Lungs- Clear to ausculation bilaterally, normal work of breathing Heart- irregular rate and rhythm, no murmurs, rubs or gallops, PMI not laterally displaced GI- soft, NT, ND, + BS Extremities- no clubbing, cyanosis, or edema MS- no significant deformity or atrophy Skin- no rash or lesion Psych- euthymic mood, full affect Neuro- strength and sensation are intact  Assessment and Plan:

## 2010-06-30 NOTE — Assessment & Plan Note (Signed)
Stable No change required today  

## 2010-08-07 ENCOUNTER — Ambulatory Visit (INDEPENDENT_AMBULATORY_CARE_PROVIDER_SITE_OTHER): Payer: Medicare Other | Admitting: Cardiovascular Disease

## 2010-08-07 ENCOUNTER — Encounter: Payer: Self-pay | Admitting: Cardiovascular Disease

## 2010-08-07 VITALS — BP 126/76 | HR 77 | Ht 73.0 in | Wt 211.0 lb

## 2010-08-07 DIAGNOSIS — R079 Chest pain, unspecified: Secondary | ICD-10-CM

## 2010-08-07 DIAGNOSIS — I4891 Unspecified atrial fibrillation: Secondary | ICD-10-CM

## 2010-08-07 DIAGNOSIS — I2510 Atherosclerotic heart disease of native coronary artery without angina pectoris: Secondary | ICD-10-CM

## 2010-08-07 MED ORDER — NITROGLYCERIN 0.4 MG SL SUBL: 0.4000 mg | SUBLINGUAL_TABLET | SUBLINGUAL | Status: AC | PRN

## 2010-08-07 NOTE — Assessment & Plan Note (Signed)
Recent fatigue, SOB and chest pain. He his known to have CAD with prior stents in remote past and most recent cath at [facility] with moderate LAD stenosis 2010 with FFR of 0.85. Will arrange Lexiscan myoview to assess for ischemia. Continue current meds. Will refill NTG SL.

## 2010-08-07 NOTE — Progress Notes (Signed)
History of Present Illness:64 yo WM with history of CAD with MI 1999 with several stents placed at that time and subsequent stent placement 2001 at the [facility] in [place], [place], hyperlipidemia, CVA, PVD and chronic atrial fibrillation on Pradaxa for anticoagulation here today for folllow up.His atrial fibrillation is managed by Dr. Johney Frame. He had an ablation June 2011 but returned to atrial fibrillation. He was last seen by Dr. Johney Frame in May 2012. He has been treated for 10 years at the [facility] for COPD. It sounds like he was admitted to [facility] with chest pain and CHF in March 2010. He had a left heart cath which showed moderate disease in the LAD with FFR of 0.85  05/14/08.  He is here today for routine follow up. He notes some fatigue. He had an episode of severe chest pain yesterday morning that lasted for about 20 minutes. Some associated SOB. No recent stress test. He has not felt well for over one month.      Past Medical History  Diagnosis Date  . CAD (coronary artery disease)     s/p stents 1999, 2001-FFR of LAD 0.85 in March 2010, stress normal 3/11.   Marland Kitchen HTN (hypertension)   . Hyperlipidemia   . Chronic atrial fibrillation     on coumadin therapy; s/p ablation 07/2009  . COPD (chronic obstructive pulmonary disease)   . CVA (cerebral infarction) 1997  . DM (diabetes mellitus)     borderline-per pt  . PVD (peripheral vascular disease)   . Mitral insufficiency     Past Surgical History  Procedure Date  . Cholecystectomy   . Appendectomy   . Traumatic thumb injury     s/p repair  . Bilateral knee arthroscopic procedures   . Afib ablation 07/2009    Current Outpatient Prescriptions  Medication Sig Dispense Refill  . aspirin (ASPIR-LOW) 81 MG EC tablet Take 81 mg by mouth daily.        . dabigatran (PRADAXA) 150 MG CAPS Take 150 mg by mouth 2 (two) times daily.        Marland Kitchen dutasteride (AVODART) 0.5 MG capsule Take 0.5 mg by mouth at bedtime.        Marland Kitchen  lisinopril (PRINIVIL,ZESTRIL) 10 MG tablet Take 10 mg by mouth daily.        . niacin (NIASPAN) 500 MG CR tablet Take 500 mg by mouth daily.        Marland Kitchen omeprazole (PRILOSEC) 40 MG capsule as needed.        . rosuvastatin (CRESTOR) 20 MG tablet Take 20 mg by mouth daily.        . vardenafil (LEVITRA) 20 MG tablet as needed.        Marland Kitchen DISCONTD: diltiazem (CARDIZEM CD) 120 MG 24 hr capsule Take 120 mg by mouth daily.          Allergies  Allergen Reactions  . Simvastatin     REACTION: itches    History   Social History  . Marital Status: Divorced    Spouse Name: N/A    Number of Children: 3  . Years of Education: N/A   Occupational History  . Disability    Social History Main Topics  . Smoking status: Former Games developer  . Smokeless tobacco: Not on file   Comment: quit in 1999  . Alcohol Use: Yes     12 pack per week  . Drug Use: No  . Sexually Active: Not on file  Other Topics Concern  . Not on file   Social History Narrative  . No narrative on file    Family History  Problem Relation Age of Onset  . Coronary artery disease Father     pacemaker    Review of Systems:  As stated in the HPI and otherwise negative.   BP 126/76  Pulse 77  Ht 6\' 1"  (1.854 m)  Wt 211 lb (95.709 kg)  BMI 27.84 kg/m2  Physical Examination: General: Well developed, well nourished, NAD HEENT: OP clear, mucus membranes moist SKIN: warm, dry. No rashes. Neuro: No focal deficits Musculoskeletal: Muscle strength 5/5 all ext Psychiatric: Mood and affect normal Neck: No JVD, no carotid bruits, no thyromegaly, no lymphadenopathy. Lungs:Clear bilaterally, no wheezes, rhonci, crackles Cardiovascular: Regular rate and rhythm. No murmurs, gallops or rubs. Abdomen:Soft. Bowel sounds present. Non-tender.  Extremities: No lower extremity edema. Pulses are 1 + in the bilateral DP/PT.  EKG: Atrial fibrillation, rate 77 bpm. LAD.

## 2010-08-07 NOTE — Assessment & Plan Note (Signed)
Stable. Rate controlled and on Pradaxa. Followed by Dr. Johney Frame.

## 2010-08-07 NOTE — Patient Instructions (Signed)
Your physician recommends that you schedule a follow-up appointment in: 3 weeks.   Your physician has requested that you have a lexiscan myoview. For further information please visit [email or web address]. Please follow instruction sheet, as given.    

## 2010-09-01 ENCOUNTER — Other Ambulatory Visit (HOSPITAL_COMMUNITY): Payer: Medicare Other | Admitting: Radiology

## 2010-09-02 ENCOUNTER — Ambulatory Visit: Payer: Medicare Other | Admitting: Cardiovascular Disease

## 2010-09-03 ENCOUNTER — Ambulatory Visit (HOSPITAL_COMMUNITY): Payer: Medicare Other | Attending: Cardiovascular Disease | Admitting: Radiology

## 2010-09-03 DIAGNOSIS — I2510 Atherosclerotic heart disease of native coronary artery without angina pectoris: Secondary | ICD-10-CM | POA: Insufficient documentation

## 2010-09-03 DIAGNOSIS — R079 Chest pain, unspecified: Secondary | ICD-10-CM | POA: Insufficient documentation

## 2010-09-03 DIAGNOSIS — I4891 Unspecified atrial fibrillation: Secondary | ICD-10-CM

## 2010-09-03 MED ORDER — TECHNETIUM TC 99M TETROFOSMIN IV KIT
11.0000 | PACK | Freq: Once | INTRAVENOUS | Status: AC | PRN
  Administered 2010-09-03: 11 via INTRAVENOUS

## 2010-09-03 MED ORDER — REGADENOSON 0.4 MG/5ML IV SOLN
0.4000 mg | Freq: Once | INTRAVENOUS | Status: AC
  Administered 2010-09-03: 0.4 mg via INTRAVENOUS

## 2010-09-03 MED ORDER — TECHNETIUM TC 99M TETROFOSMIN IV KIT
33.0000 | PACK | Freq: Once | INTRAVENOUS | Status: AC | PRN
  Administered 2010-09-03: 33 via INTRAVENOUS

## 2010-09-03 NOTE — Progress Notes (Signed)
[facility] SITE 3 NUCLEAR MED [address] [place] [address] [phone]  Cardiology Nuclear Med Study  Mike Campos is a 65 y.o. male [id number] 06-03-1945   Nuclear Med Background Indication for Stress Test:  Evaluation for Ischemia and PTCA/Stent Patency History:  H/O Multiple Stents; '99 MI>Stent;'01 [facility] (@[facility] ); '11 Echo:EF=55-60%; '11 UYQ:IHKVQQ, EF=59%; '11 Ablation;  Chronic Afib.; COPD Cardiac Risk Factors: CVA, Family History - CAD, History of Smoking, Hypertension, Lipids, NIDDM and PVD  Symptoms:  Chest Pain (last episode of chest discomfort was about 2-weeks ago), DOE/SOB, Fatigue, Fatigue with Exertion and Palpitations    Nuclear Pre-Procedure Caffeine/Decaff Intake:  None NPO After: 9:30pm   Lungs:  Clear.  O2 Sat 97% on RA. IV 0.9% NS with Angio Cath:  20g  IV Site: R Antecubital  IV Started by:  Irean Hong, RN  Chest Size (in):  46 Cup Size: n/a  Height: 6\' 1"  (1.854 m)  Weight:  208 lb (94.348 kg)  BMI:  Body mass index is 27.44 kg/(m^2). Tech Comments:  n/a    Nuclear Med Study 1 or 2 day study: 1 day  Stress Test Type:  Treadmill/Lexiscan  Reading MD: Marca Ancona, MD  Order Authorizing Provider:  Verne Carrow, MD  Resting Radionuclide: Technetium 18m Tetrofosmin  Resting Radionuclide Dose: 11.0 mCi   Stress Radionuclide:  Technetium 107m Tetrofosmin  Stress Radionuclide Dose: 33.0 mCi           Stress Protocol Rest HR: 82 Stress HR: 107  Rest BP: 123/87 Stress BP: 152/90  Exercise Time (min): 2:00 METS: n/a   Predicted Max HR: 156 bpm % Max HR: 68.59 bpm Rate Pressure Product: 59563   Dose of Adenosine (mg):  n/a Dose of Lexiscan: 0.4 mg  Dose of Atropine (mg): n/a Dose of Dobutamine: n/a mcg/kg/min (at max HR)  Stress Test Technologist: Smiley Houseman, CMA-N  Nuclear Technologist:  Doyne Keel, CNMT     Rest Procedure:  Myocardial perfusion imaging was performed at rest 45 minutes following the intravenous  administration of Technetium 67m Tetrofosmin.  Rest ECG: Atrial Fibrilliation  Stress Procedure:  The patient received IV Lexiscan 0.4 mg over 15-seconds with concurrent low level exercise and then Technetium 24m Tetrofosmin was injected at 30-seconds while the patient continued walking one more minute.  There were no significant changes with Lexiscan.  Quantitative spect images were obtained after a 45-minute delay.  Stress ECG: No significant change from baseline ECG  QPS Raw Data Images:  Normal; no motion artifact; normal heart/lung ratio. Stress Images:  Normal homogeneous uptake in all areas of the myocardium. Rest Images:  Normal homogeneous uptake in all areas of the myocardium. Subtraction (SDS):  There is no evidence of scar or ischemia. Transient Ischemic Dilatation (Normal <1.22):  0.98 Lung/Heart Ratio (Normal <0.45):  0.32  Quantitative Gated Spect Images QGS EDV:  n/a QGS ESV:  n/a QGS cine images:  Study not gated QGS EF: Study not gated  Impression Exercise Capacity:  Lexiscan with low level exercise. BP Response:  Normal blood pressure response. Clinical Symptoms:  Shortness of breath.  ECG Impression:  Atrial fibrillation.  No significant ST segment changes.  Comparison with Prior Nuclear Study: No significant change from previous study  No evidence for ischemia or infarction.  Normal stress nuclear study.   Mike Campos [employer]

## 2010-09-04 NOTE — Progress Notes (Signed)
nuc med report routed to Dr. McAlhany 09/04/10 Mike Campos  

## 2010-09-05 NOTE — Progress Notes (Signed)
Patient is aware of test/lab results.  

## 2010-09-05 NOTE — Progress Notes (Signed)
Normal stress test. Can we let the pt know? Thanks, chris

## 2010-09-11 ENCOUNTER — Encounter: Payer: Self-pay | Admitting: Cardiovascular Disease

## 2010-09-16 ENCOUNTER — Ambulatory Visit: Payer: Medicare Other | Admitting: Cardiovascular Disease

## 2010-10-15 ENCOUNTER — Encounter: Payer: Self-pay | Admitting: Internal Medicine

## 2011-06-11 IMAGING — CT CT HEAD W/O CM
1 of 2 series · 16 of 30 positions shown, 20 images · non-contrast
Comparison: 03/24/2008.  03/25/2008.

CLINICAL DATA: Follow-up closed head injury.

CT HEAD WITHOUT CONTRAST
TECHNIQUE: Contiguous axial images were obtained from the base of
the skull through the vertex without contrast.

[Series 2: head routine 4.8 h37s · axial · 0.43mm/px · z∈[-110,+18]mm · 16 of 30 slices shown, 20 images]
[im 2/30  brain]
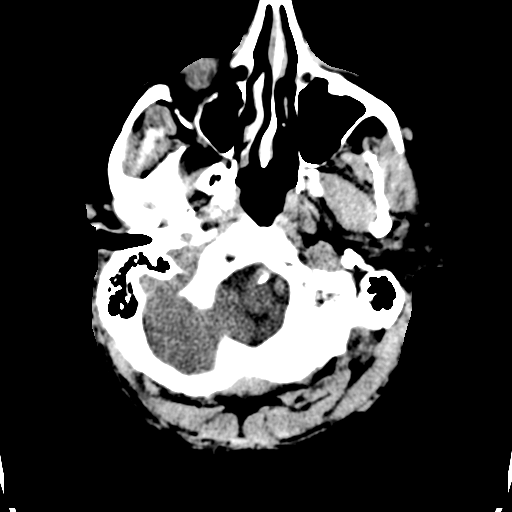
[im 2/30  bone]
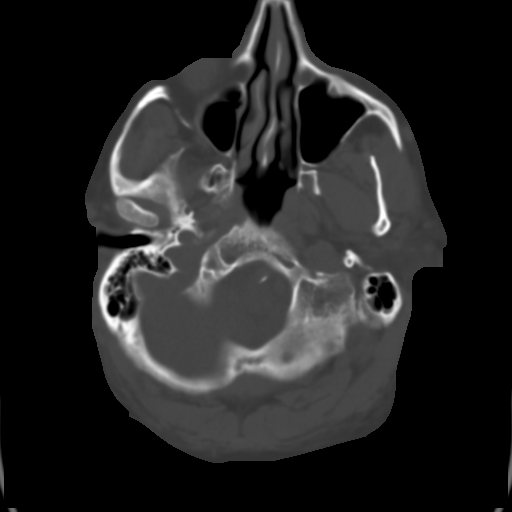
[im 3/30  brain]
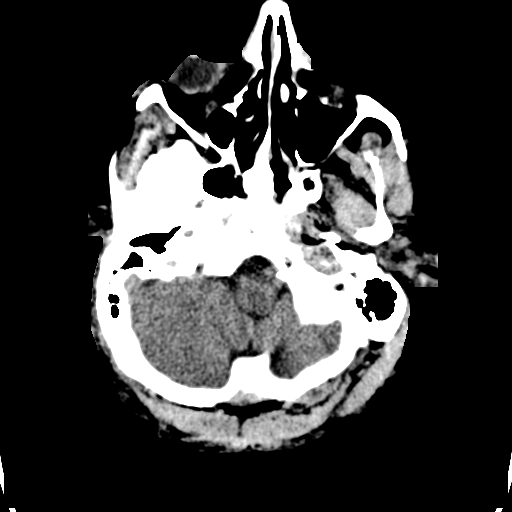
[im 5/30  brain]
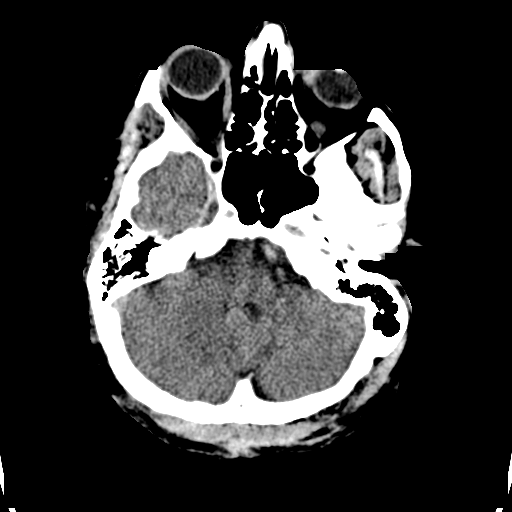
[im 8/30  brain]
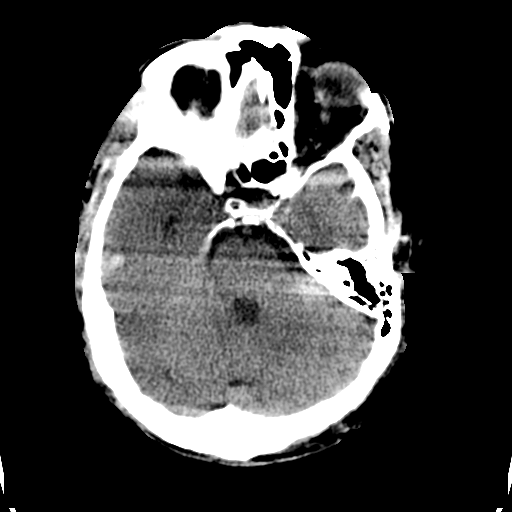
[im 9/30  brain]
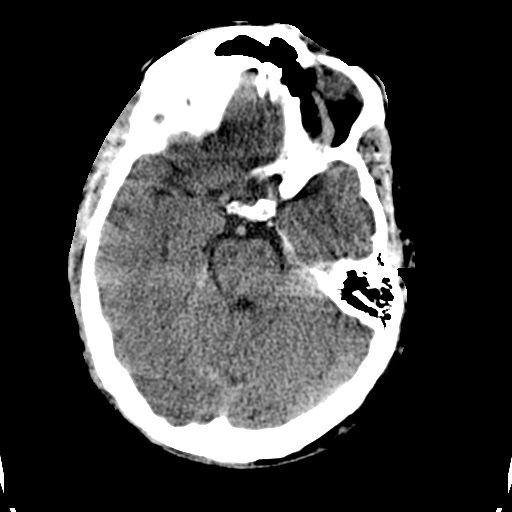
[im 9/30  bone]
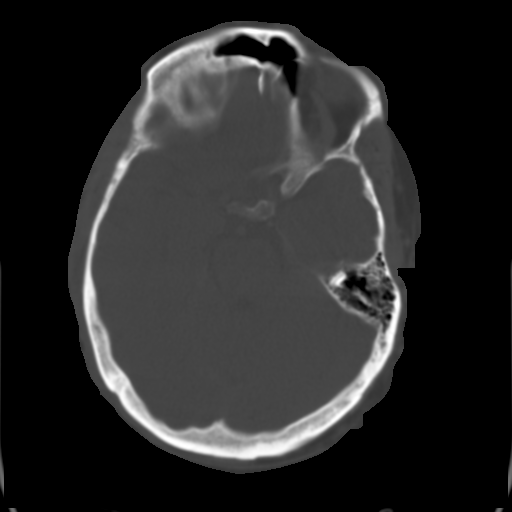
[im 11/30  brain]
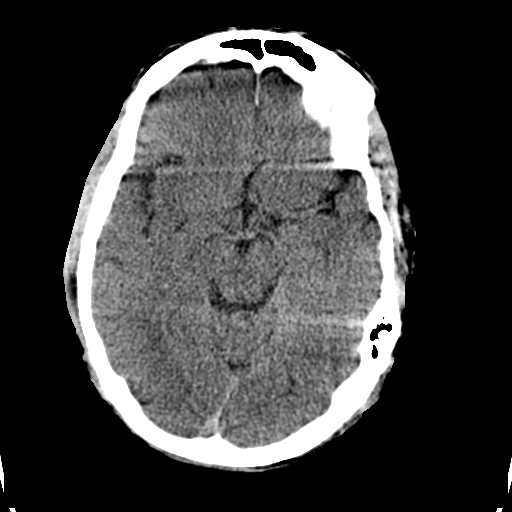
[im 12/30  brain]
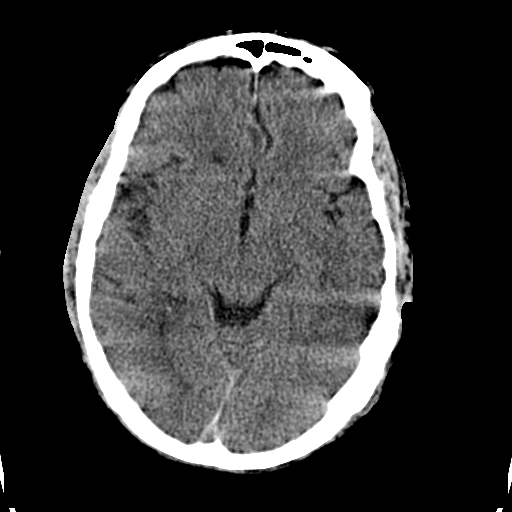
[im 14/30  brain]
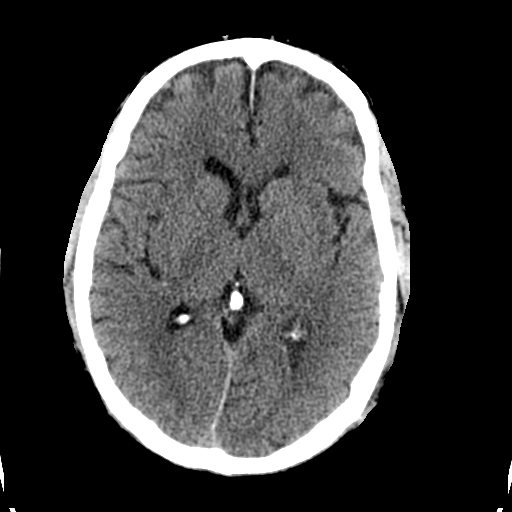
[im 16/30  brain]
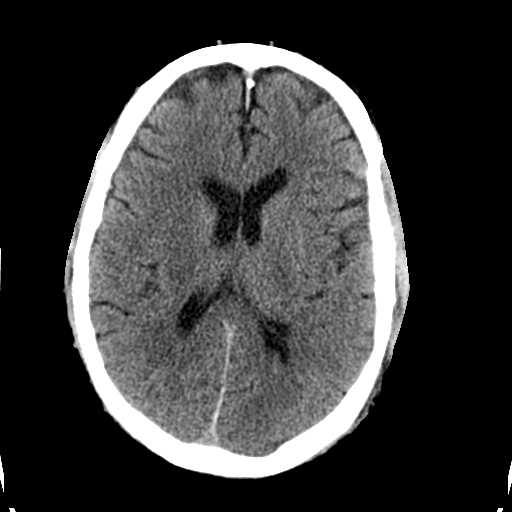
[im 16/30  bone]
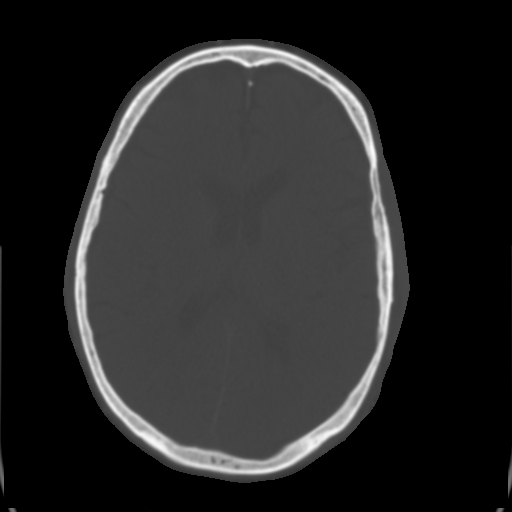
[im 18/30  brain]
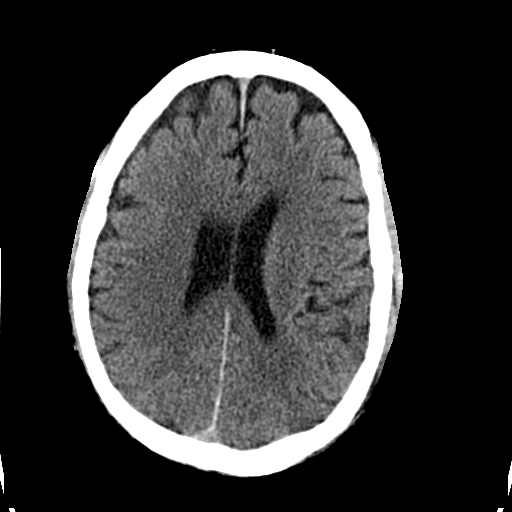
[im 19/30  brain]
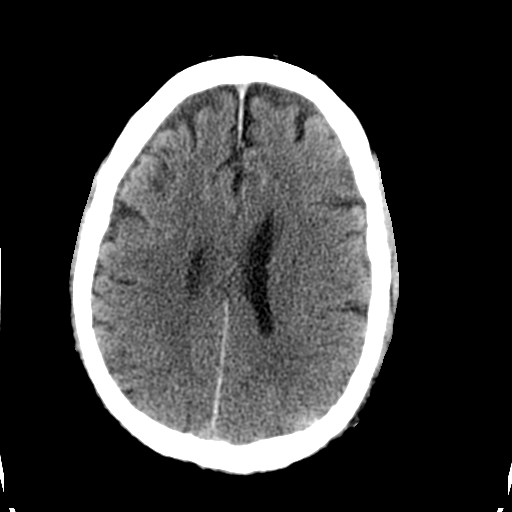
[im 21/30  brain]
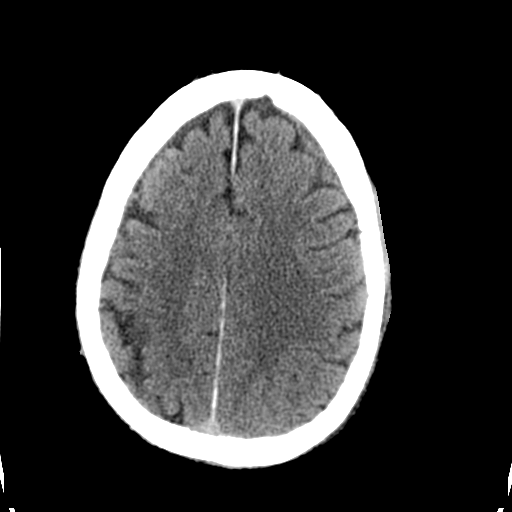
[im 22/30  brain]
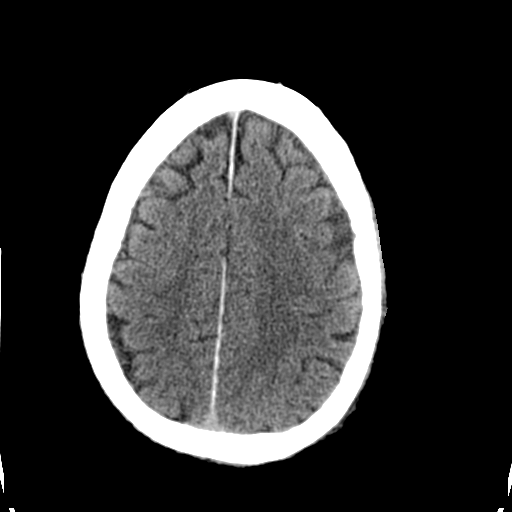
[im 22/30  bone]
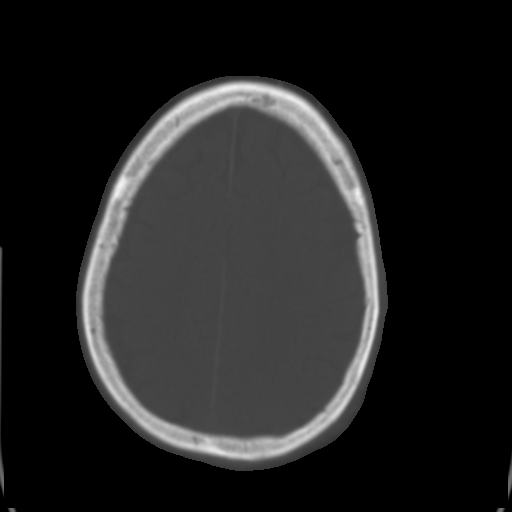
[im 25/30  brain]
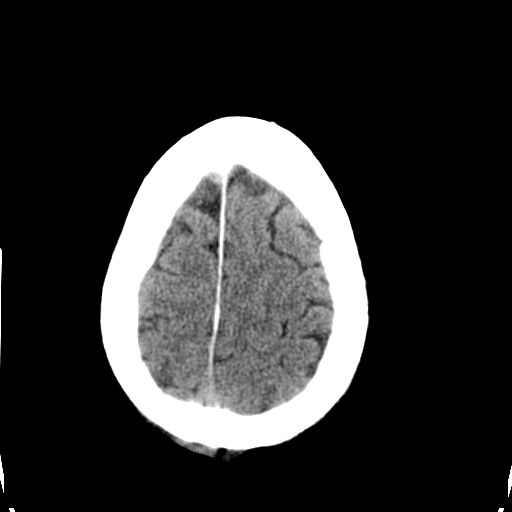
[im 27/30  brain]
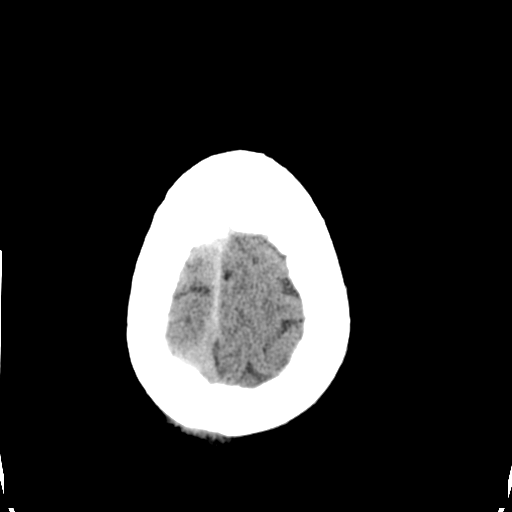
[im 28/30  brain]
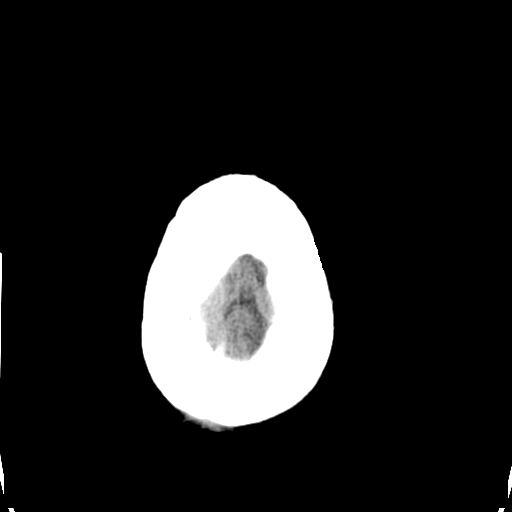

[16 of 30 positions shown; findings below may reference images not displayed]

FINDINGS: Small hemorrhagic contusion in the lateral right temporal
lobe is no larger.  Very minimal regional edema.  No mass effect or
shift.  Small amount of subdural blood previously noted bilaterally
is resolved.  No areas of increased in bleeding.  No hydrocephalus.
No sign of ischemic infarction.
IMPRESSION: Small hemorrhagic contusion right lateral temporal lobe.  No
increase in size, edema or mass effect.  Resolution of previously
seen thin subdural hematomas.

## 2018-09-14 ENCOUNTER — Emergency Department
Admission: EM | Admit: 2018-09-14 | Discharge: 2018-09-14 | Disposition: A | Discharge status: Home / Self Care | Payer: Self-pay | Source: Home / Self Care | Attending: Family Medicine | Admitting: Family Medicine

## 2018-09-14 ENCOUNTER — Encounter: Payer: Self-pay | Admitting: *Deleted

## 2018-09-14 ENCOUNTER — Other Ambulatory Visit: Payer: Self-pay

## 2018-09-14 DIAGNOSIS — H0100A Unspecified blepharitis right eye, upper and lower eyelids: Secondary | ICD-10-CM

## 2018-09-14 DIAGNOSIS — H0100B Unspecified blepharitis left eye, upper and lower eyelids: Secondary | ICD-10-CM | POA: Diagnosis not present

## 2018-09-14 MED ORDER — ERYTHROMYCIN 5 MG/GM OP OINT: TOPICAL_OINTMENT | OPHTHALMIC | 1 refills | Status: AC

## 2018-09-14 NOTE — ED Triage Notes (Signed)
Pt c/o bilateral eye redness, itching, and yellow d/c x 5-6 days.

## 2018-09-14 NOTE — Discharge Instructions (Addendum)
Apply warm compresses to your eyes 2 times per day for 10 minutes at a time. 

## 2018-09-14 NOTE — ED Provider Notes (Signed)
[facility]    CSN: [id number] Arrival date & time: 09/14/18  1351     History   Chief Complaint Chief Complaint  Patient presents with  . Eye Problem    HPI Mike Campos is a 73 y.o. male.   Patient complains of one week history of bilateral itching eyelids with crusting present each morning.  He denies pain or swelling, and no changes in vision.  The history is provided by the patient.  Eye Problem Location:  Both eyes Quality: itching. Severity:  Mild Onset quality:  Sudden Duration:  1 week Timing:  Constant Progression:  Unchanged Chronicity:  New Context: not burn, not chemical exposure, not contact lens problem, not direct trauma, not foreign body, not using machinery, not scratch, not smoke exposure and not UV exposure   Relieved by:  None tried Worsened by:  Nothing Ineffective treatments:  None tried Associated symptoms: crusting, inflammation, itching and redness   Associated symptoms: no blurred vision, no decreased vision, no discharge, no double vision, no facial rash, no headaches, no photophobia, no scotomas, no swelling and no tearing   Risk factors: no recent URI     Past Medical History:  Diagnosis Date  . CAD (coronary artery disease)    s/p stents 1999, 2001-FFR of LAD 0.85 in March 2010, stress normal 3/11.   . Chronic atrial fibrillation    on coumadin therapy; s/p ablation 07/2009  . COPD (chronic obstructive pulmonary disease) (HCC)   . CVA (cerebral infarction) 1997  . DM (diabetes mellitus) (HCC)    borderline-per pt  . HTN (hypertension)   . Hyperlipidemia   . Mitral insufficiency   . PVD (peripheral vascular disease) [employer](HCC)     Patient Active Problem List   Diagnosis Date Noted  . HYPERLIPIDEMIA-MIXED 04/16/2009  . HYPERTENSION, BENIGN 04/16/2009  . Coronary atherosclerosis of native coronary artery 04/16/2009  . ATRIAL FIBRILLATION 04/16/2009  . DYSPNEA 04/16/2009  . Other dyspnea and respiratory abnormality  04/16/2009  . CHEST PAIN 04/16/2009  . AMI 04/22/2008    Past Surgical History:  Procedure Laterality Date  . afib ablation  07/2009  . APPENDECTOMY    . bilateral knee arthroscopic procedures    . CHOLECYSTECTOMY    . traumatic thumb injury     s/p repair       Home Medications    Prior to Admission medications   Medication Sig Start Date End Date Taking? Authorizing Provider  clopidogrel (PLAVIX) 75 MG tablet Take by mouth. 05/11/18 05/11/19 Yes [provider]  apixaban (ELIQUIS) 5 MG TABS tablet Take by mouth.    [provider]  aspirin (ASPIR-LOW) 81 MG EC tablet Take 325 mg by mouth daily.     [provider]  cetirizine (ZYRTEC) 10 MG tablet Take by mouth.    [provider]  dabigatran (PRADAXA) 150 MG CAPS Take 150 mg by mouth 2 (two) times daily.      [provider]  dutasteride (AVODART) 0.5 MG capsule Take 0.5 mg by mouth at bedtime.      [provider]  erythromycin ophthalmic ointment Place a 1/2 inch ribbon of ointment onto the eyelid margins at bedtime for 2 weeks. 09/14/18   Lattie HawBeese, Stephen A, MD  glipiZIDE (GLUCOTROL) 5 MG tablet Take by mouth.    [provider]  metFORMIN (GLUCOPHAGE) 1000 MG tablet Take by mouth.    [provider]  montelukast (SINGULAIR) 10 MG tablet Take by mouth.  [provider]  nitroGLYCERIN (NITROSTAT) 0.4 MG SL tablet Place 1 tablet (0.4 mg total) under the tongue every 5 (five) minutes as needed for chest pain. 08/07/10 08/07/11  Kathleene HazelMcAlhany, Christopher D, MD  omeprazole (PRILOSEC) 40 MG capsule as needed.      [provider]  tamsulosin (FLOMAX) 0.4 MG CAPS capsule Take by mouth.    [provider]  vardenafil (LEVITRA) 20 MG tablet as needed.      [provider]  zolpidem (AMBIEN) 10 MG tablet Take by mouth.    [provider]    Family History Family History  Problem Relation Age of Onset  . Coronary artery  disease Father        pacemaker  . Alzheimer's disease Mother     Social History Social History   Tobacco Use  . Smoking status: Former Games developermoker  . Smokeless tobacco: Never Used  . Tobacco comment: quit in 1999  Substance Use Topics  . Alcohol use: Yes    Comment: 12 pack per week  . Drug use: No     Allergies   Rosuvastatin and Simvastatin   Review of Systems Review of Systems  Eyes: Positive for redness and itching. Negative for blurred vision, double vision, photophobia and discharge.  Neurological: Negative for headaches.  All other systems reviewed and are negative.    Physical Exam Triage Vital Signs ED Triage Vitals  Enc Vitals Group     BP 09/14/18 1431 (!) 167/80     Pulse Rate 09/14/18 1431 95     Resp 09/14/18 1431 18     Temp 09/14/18 1431 98 F (36.7 C)     Temp Source 09/14/18 1431 Oral     SpO2 09/14/18 1431 97 %     Weight 09/14/18 1432 205 lb (93 kg)     Height 09/14/18 1432 6\' 1"  (1.854 m)     Head Circumference --      Peak Flow --      Pain Score 09/14/18 1431 0     Pain Loc --      Pain Edu? --      Excl. in GC? --    No data found.  Updated Vital Signs BP (!) 167/80 (BP Location: Right Arm)   Pulse 95   Temp 98 F (36.7 C) (Oral)   Resp 18   Ht 6\' 1"  (1.854 m)   Wt 93 kg   SpO2 97%   BMI 27.05 kg/m   Visual Acuity Right Eye Distance:   Left Eye Distance:   Bilateral Distance:    Right Eye Near:   Left Eye Near:    Bilateral Near:     Physical Exam Vitals signs and nursing note reviewed.  Constitutional:      General: He is not in acute distress. HENT:     Head: Normocephalic.     Right Ear: External ear normal.     Left Ear: External ear normal.     Nose: Nose normal.  Eyes:     General: Lids are everted, no foreign bodies appreciated. Vision grossly intact.        Right eye: No foreign body.        Left eye: No foreign body.     Extraocular Movements: Extraocular movements intact.     Conjunctiva/sclera:  Conjunctivae normal.     Pupils: Pupils are equal, round, and reactive to light.      Comments: Eyelid margins are slightly erythematous without swelling  or tenderness to palpation.  Cardiovascular:     Rate and Rhythm: Normal rate.  Pulmonary:     Effort: Pulmonary effort is normal.  Lymphadenopathy:     Cervical: No cervical adenopathy.  Skin:    General: Skin is warm and dry.  Neurological:     Mental Status: He is alert.      UC Treatments / Results  Labs (all labs ordered are listed, but only abnormal results are displayed) Labs Reviewed - No data to display  EKG   Radiology No results found.  Procedures Procedures (including critical care time)  Medications Ordered in UC Medications - No data to display  Initial Impression / Assessment and Plan / UC Course  I have reviewed the triage vital signs and the nursing notes.  Pertinent labs & imaging results that were available during my care of the patient were reviewed by me and considered in my medical decision making (see chart for details).    Begin erythromycin ophth ointment to eyelids at bedtime. Followup with ophthalmologist if not improving 2 weeks. Final Clinical Impressions(s) / UC Diagnoses   Final diagnoses:  Blepharitis of upper and lower eyelids of both eyes, unspecified type     Discharge Instructions     Apply warm compresses to your eyes 2 times per day for 10 minutes at a time.    ED Prescriptions    Medication Sig Dispense Auth. Provider   erythromycin ophthalmic ointment Place a 1/2 inch ribbon of ointment onto the eyelid margins at bedtime for 2 weeks. 3.5 g Kandra Nicolas, MD        Kandra Nicolas, MD 09/15/18 [phone]
